# Patient Record
Sex: Female | Born: 1955 | Race: Black or African American | Hispanic: No | Marital: Single | Smoking: Former smoker
Health system: Southern US, Community
[De-identification: ages and names within clinical notes are randomized; demographics above are authoritative.]

## PROBLEM LIST (undated history)

## (undated) DIAGNOSIS — F419 Anxiety disorder, unspecified: Secondary | ICD-10-CM

## (undated) DIAGNOSIS — O019 Hydatidiform mole, unspecified: Secondary | ICD-10-CM

## (undated) DIAGNOSIS — K589 Irritable bowel syndrome without diarrhea: Secondary | ICD-10-CM

## (undated) DIAGNOSIS — R5382 Chronic fatigue, unspecified: Secondary | ICD-10-CM

## (undated) DIAGNOSIS — G9332 Myalgic encephalomyelitis/chronic fatigue syndrome: Secondary | ICD-10-CM

## (undated) DIAGNOSIS — M1990 Unspecified osteoarthritis, unspecified site: Secondary | ICD-10-CM

## (undated) DIAGNOSIS — E162 Hypoglycemia, unspecified: Secondary | ICD-10-CM

## (undated) DIAGNOSIS — I10 Essential (primary) hypertension: Secondary | ICD-10-CM

## (undated) HISTORY — DX: Essential (primary) hypertension: I10

## (undated) HISTORY — DX: Hydatidiform mole, unspecified: O01.9

## (undated) HISTORY — DX: Anxiety disorder, unspecified: F41.9

## (undated) HISTORY — DX: Myalgic encephalomyelitis/chronic fatigue syndrome: G93.32

## (undated) HISTORY — PX: PILONIDAL CYST EXCISION: SHX744

## (undated) HISTORY — DX: Chronic fatigue, unspecified: R53.82

---

## 1997-09-16 ENCOUNTER — Encounter: Admission: RE | Admit: 1997-09-16 | Discharge: 1997-09-16 | Payer: Self-pay | Admitting: Family Medicine

## 1997-11-19 ENCOUNTER — Encounter: Admission: RE | Admit: 1997-11-19 | Discharge: 1997-11-19 | Payer: Self-pay | Admitting: Family Medicine

## 1997-12-24 ENCOUNTER — Encounter: Admission: RE | Admit: 1997-12-24 | Discharge: 1997-12-24 | Payer: Self-pay | Admitting: Family Medicine

## 1998-01-04 ENCOUNTER — Encounter: Admission: RE | Admit: 1998-01-04 | Discharge: 1998-01-04 | Payer: Self-pay | Admitting: Sports Medicine

## 1998-01-27 ENCOUNTER — Encounter: Admission: RE | Admit: 1998-01-27 | Discharge: 1998-01-27 | Payer: Self-pay | Admitting: Family Medicine

## 1998-07-19 ENCOUNTER — Encounter: Admission: RE | Admit: 1998-07-19 | Discharge: 1998-07-19 | Payer: Self-pay | Admitting: Family Medicine

## 1998-07-23 ENCOUNTER — Encounter: Admission: RE | Admit: 1998-07-23 | Discharge: 1998-07-23 | Payer: Self-pay | Admitting: Family Medicine

## 1998-07-26 ENCOUNTER — Encounter: Admission: RE | Admit: 1998-07-26 | Discharge: 1998-07-26 | Payer: Self-pay | Admitting: Family Medicine

## 1998-07-27 ENCOUNTER — Encounter: Admission: RE | Admit: 1998-07-27 | Discharge: 1998-07-27 | Payer: Self-pay | Admitting: Sports Medicine

## 1998-08-11 ENCOUNTER — Encounter: Admission: RE | Admit: 1998-08-11 | Discharge: 1998-08-11 | Payer: Self-pay | Admitting: Family Medicine

## 1998-08-20 ENCOUNTER — Other Ambulatory Visit: Admission: RE | Admit: 1998-08-20 | Discharge: 1998-08-20 | Payer: Self-pay | Admitting: *Deleted

## 1998-10-22 ENCOUNTER — Encounter: Admission: RE | Admit: 1998-10-22 | Discharge: 1998-10-22 | Payer: Self-pay | Admitting: Family Medicine

## 1998-11-16 ENCOUNTER — Encounter: Admission: RE | Admit: 1998-11-16 | Discharge: 1998-11-16 | Payer: Self-pay | Admitting: Sports Medicine

## 1999-06-17 ENCOUNTER — Encounter: Admission: RE | Admit: 1999-06-17 | Discharge: 1999-06-17 | Payer: Self-pay | Admitting: Family Medicine

## 1999-06-24 ENCOUNTER — Encounter: Admission: RE | Admit: 1999-06-24 | Discharge: 1999-06-24 | Payer: Self-pay | Admitting: Family Medicine

## 1999-07-21 ENCOUNTER — Encounter: Admission: RE | Admit: 1999-07-21 | Discharge: 1999-07-21 | Payer: Self-pay | Admitting: *Deleted

## 1999-11-29 ENCOUNTER — Encounter: Admission: RE | Admit: 1999-11-29 | Discharge: 1999-11-29 | Payer: Self-pay | Admitting: Sports Medicine

## 2000-02-08 ENCOUNTER — Encounter: Admission: RE | Admit: 2000-02-08 | Discharge: 2000-02-08 | Payer: Self-pay | Admitting: Family Medicine

## 2000-02-28 ENCOUNTER — Encounter: Payer: Self-pay | Admitting: *Deleted

## 2000-02-28 ENCOUNTER — Ambulatory Visit (HOSPITAL_COMMUNITY): Admission: RE | Admit: 2000-02-28 | Discharge: 2000-02-28 | Payer: Self-pay | Admitting: *Deleted

## 2000-04-11 ENCOUNTER — Encounter: Admission: RE | Admit: 2000-04-11 | Discharge: 2000-04-11 | Payer: Self-pay | Admitting: Family Medicine

## 2000-04-17 ENCOUNTER — Encounter: Admission: RE | Admit: 2000-04-17 | Discharge: 2000-04-17 | Payer: Self-pay | Admitting: Family Medicine

## 2000-08-09 ENCOUNTER — Other Ambulatory Visit: Admission: RE | Admit: 2000-08-09 | Discharge: 2000-08-09 | Payer: Self-pay | Admitting: Obstetrics and Gynecology

## 2000-09-11 ENCOUNTER — Encounter: Admission: RE | Admit: 2000-09-11 | Discharge: 2000-09-11 | Payer: Self-pay | Admitting: Family Medicine

## 2000-09-13 ENCOUNTER — Encounter: Admission: RE | Admit: 2000-09-13 | Discharge: 2000-09-13 | Payer: Self-pay | Admitting: Sports Medicine

## 2000-09-13 ENCOUNTER — Encounter: Payer: Self-pay | Admitting: Sports Medicine

## 2000-10-04 ENCOUNTER — Encounter: Admission: RE | Admit: 2000-10-04 | Discharge: 2000-10-04 | Payer: Self-pay | Admitting: Family Medicine

## 2000-10-05 ENCOUNTER — Encounter: Admission: RE | Admit: 2000-10-05 | Discharge: 2000-11-09 | Payer: Self-pay | Admitting: Sports Medicine

## 2001-05-10 ENCOUNTER — Encounter: Admission: RE | Admit: 2001-05-10 | Discharge: 2001-05-10 | Payer: Self-pay | Admitting: Family Medicine

## 2001-07-02 ENCOUNTER — Emergency Department (HOSPITAL_COMMUNITY): Admission: EM | Admit: 2001-07-02 | Discharge: 2001-07-03 | Payer: Self-pay | Admitting: Emergency Medicine

## 2001-07-03 ENCOUNTER — Encounter
Admission: RE | Admit: 2001-07-03 | Discharge: 2001-07-03 | Payer: Self-pay | Admitting: Physical Medicine & Rehabilitation

## 2001-08-13 ENCOUNTER — Encounter: Admission: RE | Admit: 2001-08-13 | Discharge: 2001-08-13 | Payer: Self-pay | Admitting: Surgery

## 2001-08-13 ENCOUNTER — Encounter: Payer: Self-pay | Admitting: Surgery

## 2001-08-14 ENCOUNTER — Other Ambulatory Visit: Admission: RE | Admit: 2001-08-14 | Discharge: 2001-08-14 | Payer: Self-pay | Admitting: *Deleted

## 2001-11-20 ENCOUNTER — Encounter: Admission: RE | Admit: 2001-11-20 | Discharge: 2001-11-20 | Payer: Self-pay | Admitting: Family Medicine

## 2002-03-21 ENCOUNTER — Ambulatory Visit (HOSPITAL_COMMUNITY): Admission: RE | Admit: 2002-03-21 | Discharge: 2002-03-21 | Payer: Self-pay | Admitting: Gastroenterology

## 2002-07-22 ENCOUNTER — Other Ambulatory Visit: Admission: RE | Admit: 2002-07-22 | Discharge: 2002-07-22 | Payer: Self-pay | Admitting: *Deleted

## 2002-07-22 ENCOUNTER — Encounter: Admission: RE | Admit: 2002-07-22 | Discharge: 2002-07-22 | Payer: Self-pay | Admitting: *Deleted

## 2002-07-22 ENCOUNTER — Encounter: Payer: Self-pay | Admitting: *Deleted

## 2002-09-19 ENCOUNTER — Ambulatory Visit (HOSPITAL_COMMUNITY): Admission: RE | Admit: 2002-09-19 | Discharge: 2002-09-19 | Payer: Self-pay | Admitting: *Deleted

## 2003-04-03 ENCOUNTER — Encounter: Admission: RE | Admit: 2003-04-03 | Discharge: 2003-04-03 | Payer: Self-pay | Admitting: Internal Medicine

## 2003-04-23 ENCOUNTER — Encounter: Admission: RE | Admit: 2003-04-23 | Discharge: 2003-04-23 | Payer: Self-pay | Admitting: Obstetrics

## 2005-03-10 ENCOUNTER — Encounter: Admission: RE | Admit: 2005-03-10 | Discharge: 2005-03-10 | Payer: Self-pay | Admitting: Internal Medicine

## 2005-04-19 ENCOUNTER — Encounter: Admission: RE | Admit: 2005-04-19 | Discharge: 2005-04-19 | Payer: Self-pay | Admitting: Gastroenterology

## 2005-05-29 ENCOUNTER — Encounter: Admission: RE | Admit: 2005-05-29 | Discharge: 2005-05-29 | Payer: Self-pay | Admitting: Internal Medicine

## 2005-06-13 ENCOUNTER — Ambulatory Visit (HOSPITAL_COMMUNITY): Admission: RE | Admit: 2005-06-13 | Discharge: 2005-06-13 | Payer: Self-pay | Admitting: Gastroenterology

## 2005-10-18 ENCOUNTER — Other Ambulatory Visit: Admission: RE | Admit: 2005-10-18 | Discharge: 2005-10-18 | Payer: Self-pay | Admitting: Obstetrics and Gynecology

## 2007-09-25 IMAGING — CR DG UGI W/ SMALL BOWEL HIGH DENSITY
3 series · 3 of 3 positions shown · non-contrast
Comparison: none

CLINICAL DATA: Weight loss.
 KUB:  
 Unremarkable.
 UPPER GI AND SMALL BOWEL FOLLOW THROUGH:
 Swallowing mechanism normal.  No lesions of the esophagus.  No hiatal hernia or reflux.  
 Stomach, pyloric region, and duodenal bulb normal.  C-loop and remainder of duodenum normal.
 An overhead image at 20 minutes shows barium just reaching the colon.  Subsequent fluoroscopy with spot films show rather brisk peristalsis.  There are no focal lesions of the small bowel.  On initial spot films of the terminal ileum, there appears to be a large filling defect in the tip of the cecum.  It looks mottled and is likely stool, but I did feel obligated to do delayed images to make sure this was not a mass.  Respotting this area about 10 minutes later shows that the cecum fills in almost entirely.  There was still a small filing defect.  However, there is clearly not a large cecal mass as was questioned earlier.  This is a bolus of stool in the cecum simulating a mass.

[view not recorded (1 of 3)]
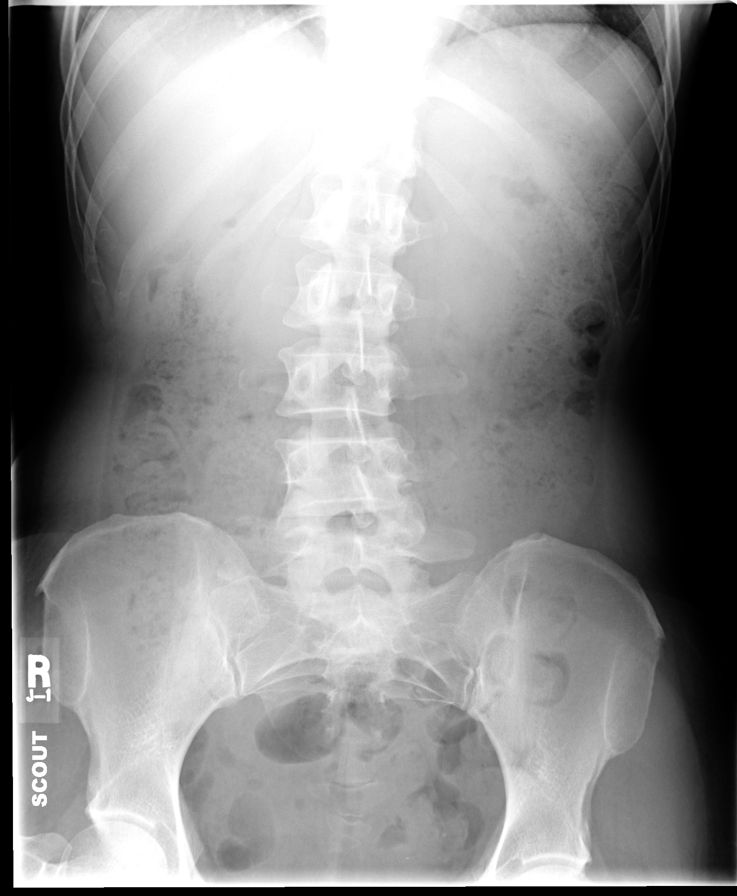

[view not recorded (2 of 3)]
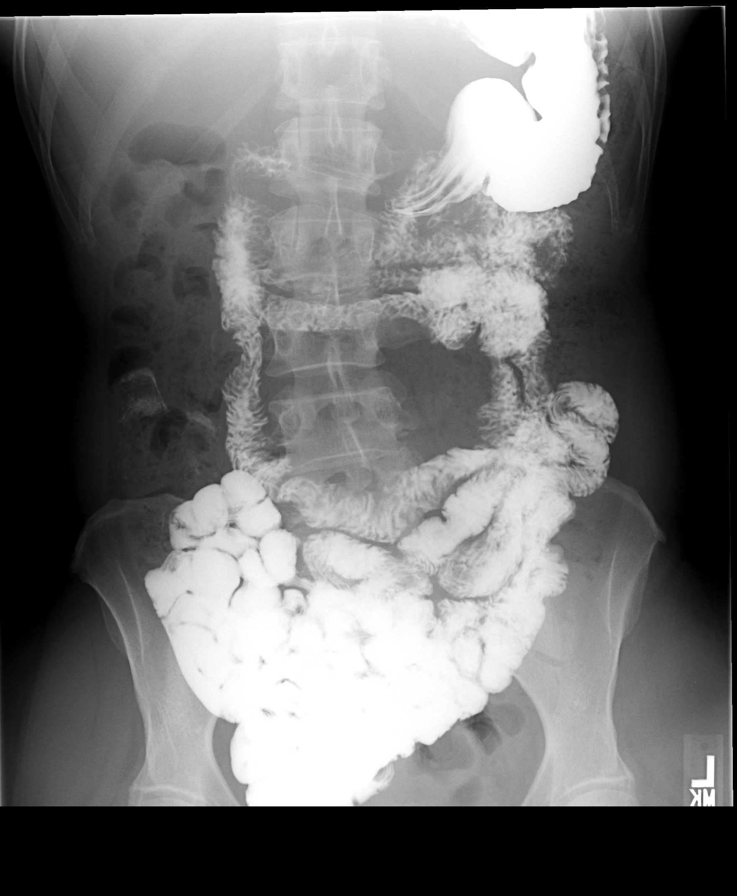

[view not recorded (3 of 3)]
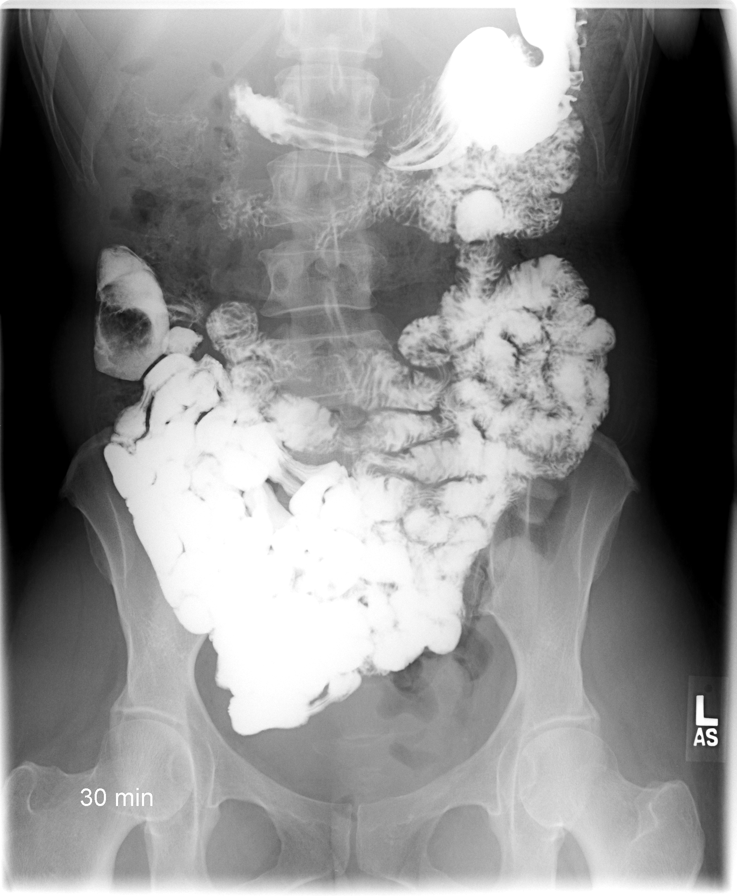

[3 of 3 positions shown; findings below may reference images not displayed]

IMPRESSION: 1.  Upper GI series normal.
 2.  Small bowel follow through shows no lesions of the small bowel, but there is rapid transit to the colon with hyperperistalsis. 
 3.  Initially, there was a ?filling defect? in the tip of the cecum that was shown to be stool on delayed images.

## 2010-06-11 ENCOUNTER — Encounter: Payer: Self-pay | Admitting: Internal Medicine

## 2010-06-12 ENCOUNTER — Encounter: Payer: Self-pay | Admitting: Obstetrics and Gynecology

## 2010-06-12 ENCOUNTER — Encounter: Payer: Self-pay | Admitting: Internal Medicine

## 2011-03-03 ENCOUNTER — Other Ambulatory Visit: Payer: Self-pay | Admitting: Obstetrics and Gynecology

## 2011-03-03 DIAGNOSIS — N644 Mastodynia: Secondary | ICD-10-CM

## 2011-03-27 ENCOUNTER — Other Ambulatory Visit: Payer: Self-pay

## 2011-04-28 ENCOUNTER — Other Ambulatory Visit (HOSPITAL_COMMUNITY): Payer: Self-pay | Admitting: Internal Medicine

## 2011-04-28 ENCOUNTER — Other Ambulatory Visit (HOSPITAL_COMMUNITY): Payer: Self-pay | Admitting: Emergency Medicine

## 2011-05-11 ENCOUNTER — Other Ambulatory Visit: Payer: Self-pay

## 2011-07-21 ENCOUNTER — Other Ambulatory Visit: Payer: Self-pay

## 2015-03-15 ENCOUNTER — Encounter: Payer: Self-pay | Admitting: Medical

## 2015-03-16 ENCOUNTER — Encounter: Payer: Self-pay | Admitting: *Deleted

## 2015-04-12 ENCOUNTER — Encounter: Payer: Self-pay | Admitting: Obstetrics and Gynecology

## 2015-04-12 ENCOUNTER — Other Ambulatory Visit (HOSPITAL_COMMUNITY)
Admission: RE | Admit: 2015-04-12 | Discharge: 2015-04-12 | Disposition: A | Discharge status: Home / Self Care | Payer: Medicaid Other | Source: Ambulatory Visit | Attending: Obstetrics and Gynecology | Admitting: Obstetrics and Gynecology

## 2015-04-12 ENCOUNTER — Ambulatory Visit (INDEPENDENT_AMBULATORY_CARE_PROVIDER_SITE_OTHER): Payer: Medicaid Other | Admitting: Obstetrics and Gynecology

## 2015-04-12 VITALS — BP 185/92 | HR 69 | Temp 98.5°F | Ht 65.5 in | Wt 154.0 lb

## 2015-04-12 DIAGNOSIS — N760 Acute vaginitis: Secondary | ICD-10-CM

## 2015-04-12 DIAGNOSIS — Z1151 Encounter for screening for human papillomavirus (HPV): Secondary | ICD-10-CM | POA: Diagnosis not present

## 2015-04-12 DIAGNOSIS — Z124 Encounter for screening for malignant neoplasm of cervix: Secondary | ICD-10-CM

## 2015-04-12 DIAGNOSIS — Z01411 Encounter for gynecological examination (general) (routine) with abnormal findings: Secondary | ICD-10-CM | POA: Diagnosis present

## 2015-04-12 NOTE — Progress Notes (Signed)
Patient ID: Anna DuverneyCathy R Stawicki, female   DOB: 10/08/1955, 59 y.o.   MRN: [id number] 59 yo G7P7 referred for the evaluation of vaginitis. Patient reports onset of vaginal irritation over the past few weeks. She describes it as a burning sensation when she washes. She uses mild, natural body soaps and laundry detergents. She is not sexually active. Patient also reports the presence of an odor intermittently over the past few weeks. Over the past 2 years or so, she has noticed the presence of a bump on her vulva. She states that the bump will become bigger at times and tender to touch. When squeezed, she is able to express a small amount of clear/greyish fluid. Patient has not had a pap smear in several years and would like one done today. She denies any postmenopausal vaginal bleeding  Past Medical History  Diagnosis Date  . Anxiety   . Hypertension   . Chronic fatigue syndrome   . Trophoblastic disease    Past Surgical History  Procedure Laterality Date  . Pilonidal cyst excision     History reviewed. No pertinent family history. Social History  Substance Use Topics  . Smoking status: Former Smoker    Quit date: 04/11/1992  . Smokeless tobacco: Never Used  . Alcohol Use: No   ROS See pertinent in HPI  Blood pressure 185/92, pulse 69, temperature 98.5 F (36.9 C), temperature source Oral, height 5' 5.5" (1.664 m), weight 154 lb (69.854 kg). GENERAL: Well-developed, well-nourished female in no acute distress.  ABDOMEN: Soft, nontender, nondistended. No organomegaly. PELVIC: Normal external female genitalia. Vagina is atrophic.  Normal discharge. Normal appearing cervix. Uterus is normal in size. No adnexal mass or tenderness. EXTREMITIES: No cyanosis, clubbing, or edema, 2+ distal pulses.  A/P 59 yo with vaginitis - Wet prep collected - pap smear collected. Patient declined a full physical exam - patient has a referral for screening mammogram and is aware that she needs to schedule that  appointment - Advised patient to pay attention to her nutrition and start probiotics as it may help with chronic vaginitis. Patient agrees that when she was taking probiotics, she was without symptoms - Discussed elevated BP today and advised the patient to follow up with her PCP. Patient states she is aware of HTN and takes her meds as needed as she does not like how they make her feel. Advised patient to follow up with PCP to find a regimen that would work better for her and allow her to take her medications as prescribed - Patient will be contacted with any abnormal results - RTC prn

## 2015-04-13 LAB — WET PREP, GENITAL
Clue Cells Wet Prep HPF POC: NONE SEEN
Trich, Wet Prep: NONE SEEN
Yeast Wet Prep HPF POC: NONE SEEN

## 2015-04-14 LAB — CYTOLOGY - PAP

## 2015-06-02 ENCOUNTER — Telehealth: Payer: Self-pay | Admitting: General Practice

## 2015-06-02 NOTE — Telephone Encounter (Signed)
Per Dr Jolayne Pantheronstant, patient needs pap in one year with cotesting for normal pap with +HPV. Called patient, no answer- left message stating we are trying to reach you with non urgent results, please call us back at the clinics.

## 2015-06-02 NOTE — Telephone Encounter (Signed)
Anna AngCathy called an left another message stating she is returning the nurse call. Please call patient between 12-12:30 and 4:00-4:30 ok to leave a message.

## 2015-06-02 NOTE — Telephone Encounter (Signed)
Anna Jenkins left a message today at 1:31 stating she is responding to call she received re: results. Called [name] and left a message I am returning her call. We are trying to reach her with some results- if you are comfortable with us leaving results on your voicemail- call us back and leave us that information on our voicemail.

## 2015-06-04 NOTE — Telephone Encounter (Signed)
Left a detailed message concerning pap smear results.

## 2015-06-08 ENCOUNTER — Telehealth: Payer: Self-pay | Admitting: *Deleted

## 2015-06-08 NOTE — Telephone Encounter (Signed)
Late entry for 06/07/15 5pm I received a message from Dr. Jolayne Panther to notify Anna Jenkins her pap smear from 03/2015 was negative for dysplacia but positive for hrhpv and reccomendation was for pap with cotesting in one year.    I called Anna Jenkins 06/07/15 5pm and notified her of results of pap and reccommendation. She voiced understanding but was concerned with the delay which we discussed. I apologized for the delay.

## 2015-06-08 NOTE — Telephone Encounter (Signed)
-----   Message from Darrel Hoover, RN sent at 06/08/2015  8:09 AM EST ----- Bonita Quin, I do not see anything documented about your conversation with this patient.  Geraldine Contras has a note that she called the patient and left a message with results.  Did the patient call back to complain?  I need you to document a note please.  Then I will give her a call. Thanks, kelly ----- Message -----    From: Gerome Apley, RN    Sent: 06/07/2015   5:15 PM      To: Darrel Hoover, RN  I called Lynden Ang to give her results of pap ( I had found on pap logs had not been followed up and sent message to Dr. Jolayne Panther who sent messge back to me , so I called patient) ( then saw apparently [facility] had called patient.   Anyways patient voices concern about why it took so long, and it looks like she had called to find out and we had tried calling her several times. She said she wants to voice a complaint. I tried talking with her and apologizing for the delay. She would like you to call her asap. It told her it would not be today, but maybe Tues or Wed you would call her.  Thanks [employer]

## 2016-09-25 ENCOUNTER — Ambulatory Visit: Payer: Medicaid Other | Admitting: Obstetrics and Gynecology

## 2016-10-20 ENCOUNTER — Ambulatory Visit (INDEPENDENT_AMBULATORY_CARE_PROVIDER_SITE_OTHER): Payer: Self-pay | Admitting: Family

## 2016-10-25 ENCOUNTER — Ambulatory Visit (INDEPENDENT_AMBULATORY_CARE_PROVIDER_SITE_OTHER): Payer: Self-pay | Admitting: Orthopaedic Surgery

## 2016-12-13 ENCOUNTER — Ambulatory Visit (INDEPENDENT_AMBULATORY_CARE_PROVIDER_SITE_OTHER): Payer: Medicaid Other

## 2016-12-13 ENCOUNTER — Ambulatory Visit (INDEPENDENT_AMBULATORY_CARE_PROVIDER_SITE_OTHER): Payer: Medicaid Other | Admitting: Orthopaedic Surgery

## 2016-12-13 DIAGNOSIS — M25561 Pain in right knee: Secondary | ICD-10-CM

## 2016-12-13 DIAGNOSIS — M5441 Lumbago with sciatica, right side: Secondary | ICD-10-CM

## 2016-12-13 DIAGNOSIS — M25551 Pain in right hip: Secondary | ICD-10-CM

## 2016-12-13 NOTE — Progress Notes (Signed)
Office Visit Note   Patient: Anna Jenkins           Date of Birth: 11/18/1955           MRN: [id number] Visit Date: 12/13/2016              Requested by: No referring provider defined for this encounter. PCP: Anna Jenkins   Assessment & Plan: Visit Diagnoses:  1. Pain in right hip   2. Acute pain of right knee   3. Acute right-sided low back pain with right-sided sciatica     Plan: I offered her a steroid injection in her right knee today as well as an aspiration but she like to wait for a few weeks while she gets some of the inflammation of her body and cuts back on wheat. I do feel be essential to try physical therapy to work on her back hip and knee and we'll order outpatient physical therapy through Anna Jenkins for varus modalities to help decrease her pain including even considering a TENS unit but mainly ionophoresis, phonophoresis and e-stim. All questions were encouraged and answered. I've also recommended her trying a topical over-the-counter medication such as Aspercreme with lidocaine and it.  Follow-Up Instructions: Return in about 3 weeks (around 01/03/2017).   Orders:  Orders Placed This Encounter  Procedures  . XR Pelvis 1-2 Views  . XR Knee 1-2 Views Right   No orders of the defined types were placed in this encounter.     Procedures: No procedures performed   Clinical Data: No additional findings.   Subjective: No chief complaint on file. The patient is someone I've seen before. She comes in today with acute right-sided pain that is accommodation of sciatica as well as knee pain. This is really flared up for last 3 days and she has significant inflammation. She is someone who has significant allergies to most medication does not tolerate most medications whether there prescription or over-the-counter. She denies any recent injuries but is having a lot of discomfort getting in and out of her car and performing activities of daily living. She denies  any change in bowel or bladder function. She does state that wheat and her system can cause a significant amount of inflammation. Denies any headache, chest pain, shortness of breath, fever, chills, nausea, vomiting.  HPI  Review of Systems   Objective: Vital Signs: There were no vitals taken for this visit.  Physical Exam She is alert and oriented 3 and in no acute distress Ortho Exam Examination does show pain and sciatic region and low back on the right side. She has a positive straight leg raise on the right side. Her hip exam itself is normal the right side. She has significant patellofemoral grinding and a mild to moderate knee effusion. The knee feels Anna Jenkins a stable with good range of motion Specialty Comments:  No specialty comments available.  Imaging: Xr Knee 1-2 Views Right  Result Date: 12/13/2016 An AP and lateral of the right knee shows well maintained medial lateral compartments however there is severe patellofemoral arthritic changes  Xr Pelvis 1-2 Views  Result Date: 12/13/2016 An AP pelvis shows normal-appearing right and left hips with no significant arthritic changes in no acute findings.    PMFS History: There are no active problems to display for this patient.  Past Medical History:  Diagnosis Date  . Anxiety   . Chronic fatigue syndrome   . Hypertension   . Trophoblastic disease  No family history on file.  Past Surgical History:  Procedure Laterality Date  . PILONIDAL CYST EXCISION     Social History   Occupational History  . Not on file.   Social History Main Topics  . Smoking status: Former Smoker    Quit date: 04/11/1992  . Smokeless tobacco: Never Used  . Alcohol use No  . Drug use: No  . Sexual activity: Not Currently    Birth control/ protection: Post-menopausal

## 2016-12-14 ENCOUNTER — Other Ambulatory Visit (INDEPENDENT_AMBULATORY_CARE_PROVIDER_SITE_OTHER): Payer: Self-pay

## 2016-12-14 DIAGNOSIS — M25561 Pain in right knee: Principal | ICD-10-CM

## 2016-12-14 DIAGNOSIS — M25551 Pain in right hip: Secondary | ICD-10-CM

## 2016-12-14 DIAGNOSIS — G8929 Other chronic pain: Secondary | ICD-10-CM

## 2017-01-03 ENCOUNTER — Ambulatory Visit (INDEPENDENT_AMBULATORY_CARE_PROVIDER_SITE_OTHER): Payer: Medicaid Other | Admitting: Orthopaedic Surgery

## 2017-01-15 ENCOUNTER — Ambulatory Visit (INDEPENDENT_AMBULATORY_CARE_PROVIDER_SITE_OTHER): Payer: Medicaid Other | Admitting: Orthopaedic Surgery

## 2017-01-29 ENCOUNTER — Ambulatory Visit (INDEPENDENT_AMBULATORY_CARE_PROVIDER_SITE_OTHER): Payer: Medicaid Other | Admitting: Orthopaedic Surgery

## 2017-02-12 ENCOUNTER — Ambulatory Visit (INDEPENDENT_AMBULATORY_CARE_PROVIDER_SITE_OTHER): Payer: Medicaid Other | Admitting: Orthopaedic Surgery

## 2017-02-26 ENCOUNTER — Ambulatory Visit (INDEPENDENT_AMBULATORY_CARE_PROVIDER_SITE_OTHER): Payer: Medicaid Other | Admitting: Orthopaedic Surgery

## 2017-03-14 ENCOUNTER — Ambulatory Visit (INDEPENDENT_AMBULATORY_CARE_PROVIDER_SITE_OTHER): Payer: Medicaid Other | Admitting: Orthopaedic Surgery

## 2017-04-04 ENCOUNTER — Ambulatory Visit (INDEPENDENT_AMBULATORY_CARE_PROVIDER_SITE_OTHER): Payer: Medicaid Other | Admitting: Orthopaedic Surgery

## 2017-05-21 ENCOUNTER — Ambulatory Visit (INDEPENDENT_AMBULATORY_CARE_PROVIDER_SITE_OTHER): Payer: Medicaid Other

## 2017-05-21 ENCOUNTER — Encounter (INDEPENDENT_AMBULATORY_CARE_PROVIDER_SITE_OTHER): Payer: Self-pay | Admitting: Specialist

## 2017-05-21 ENCOUNTER — Ambulatory Visit (INDEPENDENT_AMBULATORY_CARE_PROVIDER_SITE_OTHER): Payer: Medicaid Other | Admitting: Specialist

## 2017-05-21 VITALS — BP 127/78 | HR 65 | Ht 66.0 in | Wt 163.0 lb

## 2017-05-21 DIAGNOSIS — M2550 Pain in unspecified joint: Secondary | ICD-10-CM

## 2017-05-21 DIAGNOSIS — M79641 Pain in right hand: Secondary | ICD-10-CM | POA: Diagnosis not present

## 2017-05-21 MED ORDER — KETOROLAC TROMETHAMINE 30 MG/ML IJ SOLN: 30.0000 mg | Freq: Once | INTRAMUSCULAR | Status: AC

## 2017-05-21 MED ORDER — METHYLPREDNISOLONE ACETATE 80 MG/ML IJ SUSP: 80.0000 mg | Freq: Once | INTRAMUSCULAR | Status: DC

## 2017-05-21 NOTE — Progress Notes (Signed)
Office Visit Note   Patient: Anna Jenkins           Date of Birth: 1955-10-25           MRN: [id number] Visit Date: 05/21/2017              Requested by: Nolene Ebbs, MD [address] [place], [place] [address] PCP: Nolene Ebbs, MD   Assessment & Plan: Visit Diagnoses:  1. Pain in right hand   2. Polyarthralgia     Plan: With patient's increased right hand pain and polyarthralgias over the last several years recommend getting CBC and arthritis panel. Patient follow with me in a couple weeks to discuss lab results. In hopes of getting some relief of her pain patient was given Depo-Medrol 80 mg and Toradol 30 mg IM injections. I did offer to give patient a prednisone 6 day taper but she declined due to her not tolerating multiple medications from GI issues.  Patient sees a homeopathic doctor for her GI problem and has not seen a true GI specialist in several years. I recommend that she follow-up with gastroenterology to discuss possible endoscopy and question colonoscopy.  Follow-Up Instructions: Return in about 2 weeks (around 06/04/2017) for Jin Shockley to review labs.   Orders:  Orders Placed This Encounter  Procedures  . XR Hand Complete Right  . CBC  . Antinuclear Antib (ANA)  . Sed Rate (ESR)  . Uric acid  . Rheumatoid Factor  . C-reactive protein   Meds ordered this encounter  Medications  . methylPREDNISolone acetate (DEPO-MEDROL) injection 80 mg  . ketorolac (TORADOL) 30 MG/ML injection 30 mg      Procedures: No procedures performed   Clinical Data: No additional findings.   Subjective: Chief Complaint  Patient presents with  . Right Hand - Pain, Edema    HPI Patient comes in today with complaints of worsening right hand pain 1 week. Patient states that she has increased right hand pain and swelling more localized to the third MCP joint. She admits to having chronic swelling and stiffness bilateral hand MCP joints over the last several years but  never pain to this extent. Patient is right-hand dominant and has marked discomfort with attempting to grab objects. Decreased range of motion at the third MCP joint. Patient also complains of chronic aches and pains in bilateral shoulders, elbows and knees. States that she sees Dr. Ninfa Linden for bilateral knee osteoarthritis. Patient does not take many medications due to GI intolerance. She sees a homeopathic doctor. Has not had endoscopy and colonoscopy at least 7 years. Review of Systems  Constitutional: Negative.   HENT: Negative.   Eyes: Negative.   Respiratory: Negative.   Gastrointestinal: Negative.   Musculoskeletal: Positive for joint swelling.  Neurological: Negative.   Psychiatric/Behavioral: Negative.    No current cardiopulmonary GI GU issues  Objective: Vital Signs: BP 127/78 (BP Location: Left Arm, Patient Position: Sitting)   Pulse 65   Ht _0  (1.676 m)   Wt 163 lb (73.9 kg)   BMI 26.31 kg/m   Physical Exam  Constitutional: She is oriented to person, place, and time. She appears well-developed. No distress.  HENT:  Head: Normocephalic and atraumatic.  Eyes: Pupils are equal, round, and reactive to light.  Neck: Normal range of motion.  Pulmonary/Chest: No respiratory distress.  Musculoskeletal:  Bilateral shoulders good range of motion. Mild positive right greater left impingement test. Negative drop arm test. Good cuff strength. Bilateral elbows good range motion. Right  greater than left elbow tender palpation. Negative Tinel's bilateral cubital tunnels. Bilateral wrist good range of motion. Negative Tinel's and Phalen's test. Left hand she does have mild tenderness and swelling across the second and third MCP joints. Right hand she has moderate to marked swelling around the third MCP joint. Joint stiffness and decreased range of motion. Joint is exquisitely tender to palpation. Less tender over the second and fourth MCP joints. Neurovascularly intact. Bilateral knees  positive patellofemoral crepitus. Joint line is tender. Bilateral ankles good range of motion. Tender across the bilateral anterior tibiotalar joint lines.  Neurological: She is alert and oriented to person, place, and time.  Skin: Skin is warm and dry.  Psychiatric: She has a normal mood and affect.    Ortho Exam  Specialty Comments:  No specialty comments available.  Imaging: Xr Hand Complete Right  Result Date: 05/21/2017 X-ray right hand shows degenerative changes and periarticular spurring at the third MCP joint. No acute finding.    PMFS History: There are no active problems to display for this patient.  Past Medical History:  Diagnosis Date  . Anxiety   . Chronic fatigue syndrome   . Hypertension   . Trophoblastic disease     History reviewed. No pertinent family history.  Past Surgical History:  Procedure Laterality Date  . PILONIDAL CYST EXCISION     Social History   Occupational History  . Not on file  Tobacco Use  . Smoking status: Former Smoker    Last attempt to quit: 04/11/1992    Years since quitting: 25.1  . Smokeless tobacco: Never Used  Substance and Sexual Activity  . Alcohol use: No  . Drug use: No  . Sexual activity: Not Currently    Birth control/protection: Post-menopausal

## 2017-05-24 LAB — CBC
HCT: 41 % (ref 35.0–45.0)
Hemoglobin: 13.6 g/dL (ref 11.7–15.5)
MCH: 27.4 pg (ref 27.0–33.0)
MCHC: 33.2 g/dL (ref 32.0–36.0)
MCV: 82.5 fL (ref 80.0–100.0)
MPV: 10.4 fL (ref 7.5–12.5)
Platelets: 294 10*3/uL (ref 140–400)
RBC: 4.97 10*6/uL (ref 3.80–5.10)
RDW: 14.1 % (ref 11.0–15.0)
WBC: 4.8 10*3/uL (ref 3.8–10.8)

## 2017-05-24 LAB — ANA: Anti Nuclear Antibody(ANA): NEGATIVE

## 2017-05-24 LAB — C-REACTIVE PROTEIN: CRP: 3.2 mg/L (ref <8.0)

## 2017-05-24 LAB — URIC ACID: Uric Acid, Serum: 3.4 mg/dL (ref 2.5–7.0)

## 2017-05-24 LAB — RHEUMATOID FACTOR: Rheumatoid fact SerPl-aCnc: <14 IU/mL (ref <14)

## 2017-05-24 LAB — SEDIMENTATION RATE: Sed Rate: 9 mm/h (ref 0–30)

## 2017-06-05 ENCOUNTER — Encounter (INDEPENDENT_AMBULATORY_CARE_PROVIDER_SITE_OTHER): Payer: Self-pay | Admitting: Specialist

## 2017-06-05 ENCOUNTER — Ambulatory Visit (INDEPENDENT_AMBULATORY_CARE_PROVIDER_SITE_OTHER): Payer: Medicaid Other | Admitting: Specialist

## 2017-06-05 DIAGNOSIS — M222X1 Patellofemoral disorders, right knee: Secondary | ICD-10-CM

## 2017-06-05 DIAGNOSIS — M19041 Primary osteoarthritis, right hand: Secondary | ICD-10-CM

## 2017-06-05 MED ORDER — DICLOFENAC SODIUM 1 % TD GEL: 2.0000 g | Freq: Four times a day (QID) | TRANSDERMAL | 2 refills | Status: AC

## 2017-06-05 NOTE — Patient Instructions (Signed)
Epsom salts warm soaks BID Grip strengthening See PT for knees and OT for hands.

## 2017-06-05 NOTE — Progress Notes (Signed)
Office Visit Note   Patient: Anna Jenkins           Date of Birth: 05/11/1956           MRN: [id number] Visit Date: 06/05/2017              Requested by: Fleet ContrasAvbuere, Edwin, MD [address] [place], [place] [id number] PCP: Fleet ContrasAvbuere, Edwin, MD   Assessment & Plan: Visit Diagnoses:  1. Primary osteoarthritis, right hand   2. 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 But she has redused ROM of the right long finger MCP. Also notes nail changes and hair falling out. ?malabsorption stomach issues.  Plan: Epsom salts warm soaks BID Grip strengthening See PT for knees and OT for hands  Follow-Up Instructions: Return in about 4 weeks (around 07/03/2017).   Orders:  No orders of the defined types were placed in this encounter.  No orders of the defined types were placed in this encounter.     Procedures: No procedures performed   Clinical Data: No additional findings.   Subjective: No chief complaint on file.   62 year old female with Patellofemoral OA and right hand middle finger OA, pain and swelling is better. But she has noticed ROM of the right long finger MCP. Also notes nail changes and hair falling out. ?malabsorption stomach issues. She took steroids by mouth, reports intolerance of NSAIDs due to stomach pain. Knee pain is worsened as well.     Review of Systems  Constitutional: Negative.   HENT: Negative.   Eyes: Negative.   Respiratory: Negative.   Cardiovascular: Negative.   Gastrointestinal: Negative.   Endocrine: Negative.   Genitourinary: Negative.   Musculoskeletal: Negative.   Skin: Negative.   Allergic/Immunologic: Negative.   Neurological: Negative.   Hematological: Negative.   Psychiatric/Behavioral: Negative.      Objective: Vital Signs: There were no vitals taken for this visit.  Physical Exam  Constitutional: She is oriented to person, place, and time. She  appears well-developed and well-nourished.  HENT:  Head: Normocephalic and atraumatic.  Eyes: EOM are normal. Pupils are equal, round, and reactive to light.  Neck: Normal range of motion. Neck supple.  Pulmonary/Chest: Effort normal and breath sounds normal.  Abdominal: Soft. Bowel sounds are normal.  Musculoskeletal: Normal range of motion.  Neurological: She is alert and oriented to person, place, and time.  Skin: Skin is warm and dry.  Psychiatric: She has a normal mood and affect. Her behavior is normal. Judgment and thought content normal.    Ortho Exam  Specialty Comments:  No specialty comments available.  Imaging: No results found.   PMFS History: There are no active problems to display for this patient.  Past Medical History:  Diagnosis Date  . Anxiety   . Chronic fatigue syndrome   . Hypertension   . Trophoblastic disease     No family history on file.  Past Surgical History:  Procedure Laterality Date  . PILONIDAL CYST EXCISION     Social History   Occupational History  . Not on file  Tobacco Use  . Smoking status: Former Smoker    Last attempt to quit: 04/11/1992    Years since quitting: 25.1  . Smokeless tobacco: Never Used  Substance and Sexual Activity  . Alcohol use: No  . Drug use: No  . Sexual activity: Not Currently    Birth control/protection: Post-menopausal

## 2017-07-17 ENCOUNTER — Telehealth (INDEPENDENT_AMBULATORY_CARE_PROVIDER_SITE_OTHER): Payer: Self-pay | Admitting: Specialist

## 2017-07-17 NOTE — Telephone Encounter (Signed)
Please call pt to discuss pt care. Pt has questions pertaining to MRI. I did answer some questions for the pt it was some that I wasn't to sure about. If we have an earlier appt time please let pt know she would like to see Dr.Nitka a little earlier.

## 2017-07-19 ENCOUNTER — Ambulatory Visit (INDEPENDENT_AMBULATORY_CARE_PROVIDER_SITE_OTHER): Payer: Medicaid Other | Admitting: Specialist

## 2017-07-20 NOTE — Telephone Encounter (Signed)
I called and spoke with patient and she states that she would like to get MRI's of BOTH knees.  I did advise patient that we had only seen her for her right knee and that we may have to see her before this can be done.  She states that she has had on-going swelling in both her knees. ---Please advise on this.

## 2017-08-15 ENCOUNTER — Ambulatory Visit (INDEPENDENT_AMBULATORY_CARE_PROVIDER_SITE_OTHER): Payer: Medicaid Other | Admitting: Specialist

## 2017-10-18 ENCOUNTER — Telehealth (INDEPENDENT_AMBULATORY_CARE_PROVIDER_SITE_OTHER): Payer: Self-pay | Admitting: Specialist

## 2017-10-18 ENCOUNTER — Ambulatory Visit (INDEPENDENT_AMBULATORY_CARE_PROVIDER_SITE_OTHER): Payer: Medicaid Other

## 2017-10-18 ENCOUNTER — Encounter (INDEPENDENT_AMBULATORY_CARE_PROVIDER_SITE_OTHER): Payer: Self-pay | Admitting: Surgery

## 2017-10-18 ENCOUNTER — Ambulatory Visit (INDEPENDENT_AMBULATORY_CARE_PROVIDER_SITE_OTHER): Payer: Medicaid Other | Admitting: Surgery

## 2017-10-18 DIAGNOSIS — G8929 Other chronic pain: Secondary | ICD-10-CM

## 2017-10-18 DIAGNOSIS — M25562 Pain in left knee: Secondary | ICD-10-CM | POA: Diagnosis not present

## 2017-10-18 DIAGNOSIS — M5441 Lumbago with sciatica, right side: Secondary | ICD-10-CM | POA: Diagnosis not present

## 2017-10-18 DIAGNOSIS — M25561 Pain in right knee: Secondary | ICD-10-CM | POA: Diagnosis not present

## 2017-10-18 DIAGNOSIS — M4316 Spondylolisthesis, lumbar region: Secondary | ICD-10-CM

## 2017-10-18 DIAGNOSIS — M170 Bilateral primary osteoarthritis of knee: Secondary | ICD-10-CM | POA: Diagnosis not present

## 2017-10-18 DIAGNOSIS — M5442 Lumbago with sciatica, left side: Secondary | ICD-10-CM

## 2017-10-18 NOTE — Telephone Encounter (Signed)
Fayrene Fearing advised patient to return for an MRI review with Dr. Otelia Sergeant in 2 weeks, she is scheduled 7/18. She requested to be added to cancellation list. # [phone]

## 2017-10-18 NOTE — Progress Notes (Signed)
Office Visit Note   Patient: Anna Jenkins           Date of Birth: 10/22/1955           MRN: [id number] Visit Date: 10/18/2017              Requested by: Fleet Contras, MD [address] [place], [place] [address] PCP: Fleet Contras, MD   Assessment & Plan: Visit Diagnoses:  1. Chronic bilateral low back pain with bilateral sciatica   2. Chronic pain of left knee   3. Chronic pain of right knee   4. Spondylolisthesis, lumbar region   5. Primary osteoarthritis of both knees     Plan: Regards to patient's bilateral knee problems she will follow with Dr. Magnus Ivan to discuss treatment options there.  We will schedule lumbar MRI to rule out HNP/stenosis.  Follow-up with Dr. Otelia Sergeant after completion to discuss results and further treatment options.  Follow-Up Instructions: Return for With Dr. Magnus Ivan in 1 week for bilateral knees.  With Dr. Otelia Sergeant in 2 weeks to review lumbar spine M.   Orders:  Orders Placed This Encounter  Procedures  . XR Lumbar Spine 2-3 Views  . XR KNEE 3 VIEW RIGHT  . XR KNEE 3 VIEW LEFT  . MR Lumbar Spine w/o contrast   No orders of the defined types were placed in this encounter.     Procedures: No procedures performed   Clinical Data: No additional findings.   Subjective: Chief Complaint  Patient presents with  . Lower Back - Pain    HPI 62 year old female comes in today with complaints of low back pain, bilateral lower extremity radiculopathy and bilateral knee pain.  Patient has seen Dr. Magnus Ivan in the past for her knees.  Both knees aggravated with going up and down stairs, squatting and standing too long.  Complains of popping and grinding both knees and some swelling.  Low back pain off and on for about a year but worse the last few weeks.  No injury.  Complains of pain radiating down to both feet along with numbness and tingling.  States that walking distances have decreased over the last several months due to her ongoing symptoms.   Feels like her legs give out the further she walks.  No complaints of bowel or bladder incontinence. Review of Systems No current cardiac pulmonary GI GU issues  Objective: Vital Signs: There were no vitals taken for this visit.  Physical Exam  Constitutional: She is oriented to person, place, and time. No distress.  HENT:  Head: Normocephalic and atraumatic.  Eyes: Pupils are equal, round, and reactive to light. EOM are normal.  Pulmonary/Chest: No respiratory distress.  Musculoskeletal:  Some decrease in lumbar flexion and extension due to discomfort.  Positive lumbar paraspinal tenderness.  Negative logroll bilateral hips.  Negative straight leg raise.  Patient has tenderness over the left hip greater trochanter bursa.  Bile knees good range motion.  Positive bilateral patellofemoral crepitus and grind.  Positive bilateral medial lateral joint line tenderness.  Knee ligaments are stable.  No focal motor deficits.  Neurovascular intact.  Bilateral calves nontender.  Neurological: She is alert and oriented to person, place, and time.    Ortho Exam  Specialty Comments:  No specialty comments available.  Imaging: No results found.   PMFS History: There are no active problems to display for this patient.  Past Medical History:  Diagnosis Date  . Anxiety   . Chronic fatigue syndrome   .  Hypertension   . Trophoblastic disease     History reviewed. No pertinent family history.  Past Surgical History:  Procedure Laterality Date  . PILONIDAL CYST EXCISION     Social History   Occupational History  . Not on file  Tobacco Use  . Smoking status: Former Smoker    Last attempt to quit: 04/11/1992    Years since quitting: 25.6  . Smokeless tobacco: Never Used  Substance and Sexual Activity  . Alcohol use: No  . Drug use: No  . Sexual activity: Not Currently    Birth control/protection: Post-menopausal

## 2017-10-18 NOTE — Telephone Encounter (Signed)
Put on cancellation list 

## 2017-10-22 ENCOUNTER — Telehealth (INDEPENDENT_AMBULATORY_CARE_PROVIDER_SITE_OTHER): Payer: Self-pay | Admitting: *Deleted

## 2017-10-22 NOTE — Telephone Encounter (Signed)
Pt is scheduled for MRI lumbar spine at [facility] on Wednesday June 5 at 330pm with arrival of 3pm for registeration, I C left message for pt to return my call for appt information

## 2017-10-23 NOTE — Telephone Encounter (Signed)
IC pt again and left vm to return my call for appt information 

## 2017-10-23 NOTE — Telephone Encounter (Signed)
IC and left message on vm with pt appt date, time and location

## 2017-11-01 ENCOUNTER — Telehealth (INDEPENDENT_AMBULATORY_CARE_PROVIDER_SITE_OTHER): Payer: Self-pay | Admitting: Radiology

## 2017-11-01 NOTE — Telephone Encounter (Signed)
Patient called requesting CD of x-rays from 10/18/17 visit of lower back and both knees. I informed patient I will have CD made and placed at the front desk for pick up.

## 2017-11-05 ENCOUNTER — Telehealth (INDEPENDENT_AMBULATORY_CARE_PROVIDER_SITE_OTHER): Payer: Self-pay | Admitting: Radiology

## 2017-11-05 ENCOUNTER — Ambulatory Visit (INDEPENDENT_AMBULATORY_CARE_PROVIDER_SITE_OTHER): Payer: Medicaid Other | Admitting: Orthopaedic Surgery

## 2017-11-05 NOTE — Telephone Encounter (Signed)
Patient called and LM that she is having debilitating pain and needs to be seen ASAP.  She says she lives by herself and cannot even get up and move around and take care of herself.  She has had her MRI scan at [facility].  Her appt with Dr Otelia SergeantNitka is not until 12/06/17.  I advised her there are no sooner appts available, and that I can add her to the cancellation list. (Patient is already on this list and we will call if an appt opens up.) She does not understand why we do not have an emergency walkin clinic.  I have advised her to go to the ED if her pain is that severe.  She understands, but says that will do her no good and she will be calling her PCP for assistance.

## 2017-11-06 ENCOUNTER — Telehealth (INDEPENDENT_AMBULATORY_CARE_PROVIDER_SITE_OTHER): Payer: Self-pay | Admitting: Radiology

## 2017-11-06 NOTE — Telephone Encounter (Signed)
Patient called back yesterday afternoon to speak with a supervisor, and I s/w her again in detail.  She has not had the MRI Lumbar Spine done yet.  She has issues with transportation, and issues with claustrophobia.  She is asking for something to help with her pain.  Maybe a brace, or anything else you can offer.  She says that she does not do well with medications, and does not want to take medication for this pain.  She has an appt with Dr Otelia SergeantNitka 12/06/17.  She wants to be seen emergently, sooner than that.  I have told her there are no sooner appts at this time, but I will review the schedule to see if we have a cancellation or if we can get her in any sooner at all.  Please advise on anything to help with her pain?  I did advise her she really needs to get the MRI scan done, to further evaluate her pain/symptoms.  I again advised her that the ED is available to her as a resource should her pain be so great that she needs to be seen emergently.

## 2017-11-06 NOTE — Telephone Encounter (Signed)
There really isn't much we can do without MRI scan.  If she can get MRI I wound be ok with giving ativan for claustrophobia if she can get a ride.  Cannot give narcotic medication without knowing what I am treating.  Emergent appointment with Dr Otelia SergeantNitka will not be very beneficial because he needs study.  I agree with ED visit if pain is that great.  They can do MRI there if they feel clinically indicated.

## 2017-11-06 NOTE — Telephone Encounter (Signed)
IC patient to advise.  LMVM to call me back, briefly gave this info to her and advised her to call [facility] and if I needed to help her with scheduling the MRI scan I can.  Asked her to call me if needed, and I will still be looking for a cancellation to work her in earlier, but we do need the MRI done to further evaluate.

## 2017-11-29 ENCOUNTER — Telehealth (INDEPENDENT_AMBULATORY_CARE_PROVIDER_SITE_OTHER): Payer: Self-pay | Admitting: Radiology

## 2017-11-29 NOTE — Telephone Encounter (Signed)
Patient called stating original CD did not work.  I have made new CD and checked on 3 different computers, CD works.  Patient is aware CD is ready for pickup. Instructions to open CD was placed in CD sleeve.

## 2017-12-06 ENCOUNTER — Ambulatory Visit (INDEPENDENT_AMBULATORY_CARE_PROVIDER_SITE_OTHER): Payer: Medicaid Other | Admitting: Specialist

## 2017-12-31 ENCOUNTER — Telehealth (INDEPENDENT_AMBULATORY_CARE_PROVIDER_SITE_OTHER): Payer: Self-pay | Admitting: Surgery

## 2017-12-31 NOTE — Telephone Encounter (Signed)
Patient called advised her leg is hurting really bad and did not think Anna Jenkins would see her without her having an MRI. Patient said Anna Jenkins asked her to get one.  Patient said she is afraid to get into the machine to get an MRI.  The number to contact patient is [phone]

## 2017-12-31 NOTE — Telephone Encounter (Signed)
Please advise 

## 2018-01-08 NOTE — Telephone Encounter (Signed)
She needs MRI.

## 2018-01-08 NOTE — Telephone Encounter (Signed)
lmom for pt to call back

## 2018-01-18 NOTE — Telephone Encounter (Signed)
She never returned call

## 2018-06-06 ENCOUNTER — Encounter: Payer: Self-pay | Admitting: *Deleted

## 2018-06-13 ENCOUNTER — Encounter: Payer: Self-pay | Admitting: Obstetrics and Gynecology

## 2019-10-06 ENCOUNTER — Ambulatory Visit: Payer: Medicaid Other | Admitting: Specialist

## 2020-03-12 ENCOUNTER — Telehealth: Payer: Self-pay | Admitting: Orthopaedic Surgery

## 2020-03-12 NOTE — Telephone Encounter (Signed)
calling

## 2020-03-12 NOTE — Telephone Encounter (Signed)
I spoke the pt and she stated she is on her way to have her pcp evaluate her for DVT. She just wanted our advise since we were the speciality office. I advised her that what she is doing is a good plan and if it isnt blood clots causing her pain we will see her in the office Monday to evaluate her bilateral leg pain.

## 2020-03-12 NOTE — Telephone Encounter (Signed)
Pt stated understanding

## 2020-03-12 NOTE — Telephone Encounter (Signed)
Please call patient. Patient has a few questions and states she feels she may have a blood clot in left leg. Please call back asap. Patient phone number is [phone].

## 2020-03-15 ENCOUNTER — Ambulatory Visit: Payer: Medicaid Other | Admitting: Physician Assistant

## 2020-09-23 ENCOUNTER — Other Ambulatory Visit (HOSPITAL_COMMUNITY): Payer: Self-pay | Admitting: Internal Medicine

## 2020-09-23 ENCOUNTER — Inpatient Hospital Stay (HOSPITAL_COMMUNITY): Admission: RE | Admit: 2020-09-23 | Payer: Medicaid Other | Source: Ambulatory Visit

## 2020-09-23 DIAGNOSIS — I83813 Varicose veins of bilateral lower extremities with pain: Secondary | ICD-10-CM

## 2020-09-24 ENCOUNTER — Telehealth (HOSPITAL_COMMUNITY): Payer: Self-pay

## 2020-09-24 NOTE — Telephone Encounter (Signed)
Patient was referred over for U/S to R/O DVT on 09/23/2020, patient No Showed for appointment. I called patient and LVM for her to call back if she would like to reschedule. Fax was sent to Dr Concepcion Elk referring physician.   [employer]

## 2020-09-30 ENCOUNTER — Ambulatory Visit (HOSPITAL_COMMUNITY)
Admission: RE | Admit: 2020-09-30 | Discharge: 2020-09-30 | Disposition: A | Discharge status: Home / Self Care | Payer: Medicare Other | Source: Ambulatory Visit | Attending: Internal Medicine | Admitting: Internal Medicine

## 2020-09-30 ENCOUNTER — Other Ambulatory Visit: Payer: Self-pay

## 2020-09-30 DIAGNOSIS — I83813 Varicose veins of bilateral lower extremities with pain: Secondary | ICD-10-CM | POA: Diagnosis present

## 2021-03-28 ENCOUNTER — Other Ambulatory Visit: Payer: Self-pay | Admitting: Internal Medicine

## 2021-03-28 ENCOUNTER — Other Ambulatory Visit: Payer: Self-pay

## 2021-03-28 DIAGNOSIS — I879 Disorder of vein, unspecified: Secondary | ICD-10-CM

## 2021-03-29 LAB — CBC
HCT: 36 % (ref 35.0–45.0)
Hemoglobin: 12 g/dL (ref 11.7–15.5)
MCH: 27.5 pg (ref 27.0–33.0)
MCHC: 33.3 g/dL (ref 32.0–36.0)
MCV: 82.6 fL (ref 80.0–100.0)
MPV: 9.5 fL (ref 7.5–12.5)
Platelets: 283 10*3/uL (ref 140–400)
RBC: 4.36 10*6/uL (ref 3.80–5.10)
RDW: 13.8 % (ref 11.0–15.0)
WBC: 5.1 10*3/uL (ref 3.8–10.8)

## 2021-03-29 LAB — COMPLETE METABOLIC PANEL WITH GFR
AG Ratio: 1.1 (calc) (ref 1.0–2.5)
ALT: 8 U/L (ref 6–29)
AST: 16 U/L (ref 10–35)
Albumin: 3.6 g/dL (ref 3.6–5.1)
Alkaline phosphatase (APISO): 55 U/L (ref 37–153)
BUN: 8 mg/dL (ref 7–25)
CO2: 29 mmol/L (ref 20–32)
Calcium: 8.6 mg/dL (ref 8.6–10.4)
Chloride: 92 mmol/L — ABNORMAL LOW (ref 98–110)
Creat: 0.69 mg/dL (ref 0.50–1.05)
Globulin: 3.3 g/dL (calc) (ref 1.9–3.7)
Glucose, Bld: 91 mg/dL (ref 65–99)
Potassium: 2.8 mmol/L — ABNORMAL LOW (ref 3.5–5.3)
Sodium: 129 mmol/L — ABNORMAL LOW (ref 135–146)
Total Bilirubin: 0.5 mg/dL (ref 0.2–1.2)
Total Protein: 6.9 g/dL (ref 6.1–8.1)
eGFR: 96 mL/min/{1.73_m2} (ref >=60)

## 2021-03-29 LAB — LIPID PANEL
Cholesterol: 219 mg/dL — ABNORMAL HIGH (ref <200)
HDL: 68 mg/dL (ref >=50)
LDL Cholesterol (Calc): 137 mg/dL (calc) — ABNORMAL HIGH
Non-HDL Cholesterol (Calc): 151 mg/dL (calc) — ABNORMAL HIGH (ref <130)
Total CHOL/HDL Ratio: 3.2 (calc) (ref <5.0)
Triglycerides: 53 mg/dL (ref <150)

## 2021-03-29 LAB — TSH: TSH: 1.71 mIU/L (ref 0.40–4.50)

## 2021-04-19 ENCOUNTER — Encounter: Payer: 59 | Admitting: Vascular Surgery

## 2021-04-19 ENCOUNTER — Encounter (HOSPITAL_COMMUNITY): Payer: 59

## 2021-05-18 ENCOUNTER — Encounter: Payer: Self-pay | Admitting: *Deleted

## 2021-07-14 ENCOUNTER — Ambulatory Visit: Payer: 59 | Admitting: Podiatry

## 2022-03-08 ENCOUNTER — Emergency Department (HOSPITAL_COMMUNITY): Payer: 59

## 2022-03-08 ENCOUNTER — Other Ambulatory Visit: Payer: Self-pay

## 2022-03-08 ENCOUNTER — Encounter (HOSPITAL_COMMUNITY): Payer: Self-pay

## 2022-03-08 ENCOUNTER — Inpatient Hospital Stay (HOSPITAL_COMMUNITY)
Admission: EM | Admit: 2022-03-08 | Discharge: 2022-03-13 | DRG: 554 | Disposition: A | Discharge status: Home Health | Payer: 59 | Attending: Internal Medicine | Admitting: Internal Medicine

## 2022-03-08 DIAGNOSIS — Z885 Allergy status to narcotic agent status: Secondary | ICD-10-CM

## 2022-03-08 DIAGNOSIS — M170 Bilateral primary osteoarthritis of knee: Secondary | ICD-10-CM | POA: Diagnosis present

## 2022-03-08 DIAGNOSIS — I10 Essential (primary) hypertension: Secondary | ICD-10-CM | POA: Diagnosis present

## 2022-03-08 DIAGNOSIS — G9332 Myalgic encephalomyelitis/chronic fatigue syndrome: Secondary | ICD-10-CM | POA: Diagnosis present

## 2022-03-08 DIAGNOSIS — R262 Difficulty in walking, not elsewhere classified: Secondary | ICD-10-CM

## 2022-03-08 DIAGNOSIS — Z87891 Personal history of nicotine dependence: Secondary | ICD-10-CM

## 2022-03-08 DIAGNOSIS — Z87828 Personal history of other (healed) physical injury and trauma: Secondary | ICD-10-CM

## 2022-03-08 DIAGNOSIS — M064 Inflammatory polyarthropathy: Secondary | ICD-10-CM | POA: Diagnosis not present

## 2022-03-08 DIAGNOSIS — F419 Anxiety disorder, unspecified: Secondary | ICD-10-CM | POA: Diagnosis present

## 2022-03-08 DIAGNOSIS — Z888 Allergy status to other drugs, medicaments and biological substances status: Secondary | ICD-10-CM

## 2022-03-08 DIAGNOSIS — Z8759 Personal history of other complications of pregnancy, childbirth and the puerperium: Secondary | ICD-10-CM

## 2022-03-08 DIAGNOSIS — M25562 Pain in left knee: Principal | ICD-10-CM

## 2022-03-08 DIAGNOSIS — K5900 Constipation, unspecified: Secondary | ICD-10-CM | POA: Diagnosis present

## 2022-03-08 DIAGNOSIS — E876 Hypokalemia: Secondary | ICD-10-CM | POA: Diagnosis not present

## 2022-03-08 DIAGNOSIS — M25462 Effusion, left knee: Secondary | ICD-10-CM | POA: Diagnosis present

## 2022-03-08 DIAGNOSIS — Z79899 Other long term (current) drug therapy: Secondary | ICD-10-CM

## 2022-03-08 LAB — CBC WITH DIFFERENTIAL/PLATELET
Abs Immature Granulocytes: 0.04 10*3/uL (ref 0.00–0.07)
Basophils Absolute: 0 10*3/uL (ref 0.0–0.1)
Basophils Relative: 0 %
Eosinophils Absolute: 0 10*3/uL (ref 0.0–0.5)
Eosinophils Relative: 0 %
HCT: 38.4 % (ref 36.0–46.0)
Hemoglobin: 12.9 g/dL (ref 12.0–15.0)
Immature Granulocytes: 0 %
Lymphocytes Relative: 16 %
Lymphs Abs: 1.7 10*3/uL (ref 0.7–4.0)
MCH: 27.7 pg (ref 26.0–34.0)
MCHC: 33.6 g/dL (ref 30.0–36.0)
MCV: 82.6 fL (ref 80.0–100.0)
Monocytes Absolute: 0.9 10*3/uL (ref 0.1–1.0)
Monocytes Relative: 9 %
Neutro Abs: 8 10*3/uL — ABNORMAL HIGH (ref 1.7–7.7)
Neutrophils Relative %: 75 %
Platelets: 245 10*3/uL (ref 150–400)
RBC: 4.65 MIL/uL (ref 3.87–5.11)
RDW: 14 % (ref 11.5–15.5)
WBC: 10.7 10*3/uL — ABNORMAL HIGH (ref 4.0–10.5)
nRBC: 0 % (ref 0.0–0.2)

## 2022-03-08 MED ORDER — LIDOCAINE-EPINEPHRINE 2 %-1:100000 IJ SOLN
10.0000 mL | Freq: Once | INTRAMUSCULAR | Status: AC
  Administered 2022-03-09: 10 mL
  Filled 2022-03-08: qty 1

## 2022-03-08 MED ORDER — FENTANYL CITRATE PF 50 MCG/ML IJ SOSY
100.0000 ug | PREFILLED_SYRINGE | Freq: Once | INTRAMUSCULAR | Status: AC
  Administered 2022-03-08: 100 ug via INTRAVENOUS
  Filled 2022-03-08: qty 2

## 2022-03-08 NOTE — ED Triage Notes (Signed)
Patient having left knee pain for 4 days. Has arthritis. She said she tried voltaren, but did not help with her pain. Unable to bear weight on the knee.

## 2022-03-08 NOTE — ED Provider Triage Note (Signed)
Emergency Medicine Provider Triage Evaluation Note  Anna Jenkins , a 66 y.o. female  was evaluated in triage.  Pt complains of left knee pain, no injury, worse bearing weight. On going problem, previously seen by ortho but experienced homelessness and unable to follow up. Applying voltaren without relief.  Review of Systems  Positive: As above Negative: As above  Physical Exam  BP 135/86 (BP Location: Right Arm)   Pulse 96   Temp 98.5 F (36.9 C) (Oral)   Resp 17   Ht 5\' 6"  (1.676 m)   Wt 74 kg   SpO2 99%   BMI 26.33 kg/m  Gen:   Awake, no distress   Resp:  Normal effort  MSK:   Left knee swelling, no erythema, DP pulse present  Other:    Medical Decision Making  Medically screening exam initiated at 5:54 PM.  Appropriate orders placed.  Anna Jenkins was informed that the remainder of the evaluation will be completed by another provider, this initial triage assessment does not replace that evaluation, and the importance of remaining in the ED until their evaluation is complete.     Tacy Learn, PA-C 03/08/22 1755

## 2022-03-08 NOTE — ED Provider Notes (Signed)
[facility]-EMERGENCY DEPT Provider Note   CSN: [id number] Arrival date & time: 03/08/22  1731     History  Chief Complaint  Patient presents with   Knee Pain    Anna Jenkins is a 66 y.o. female.  Pt is a 66 yo female presenting for knee pain. Pt admits to previous minor injuries to the left knee with pain developing in 2019. Pt states she was diagnosed with osteoarthritis. Pt states she recently dealt with homelessness, is no longer homeless, but recently had to pack up and move homes. Admits to worsening of her baseline left knee pain with associated warmth and swelling. Admits to inability to ambulate due to pain. Patient also admits to light headedness, generalized weakness, chills, and decreased appetite associated with knee pain.   The history is provided by the patient. No language interpreter was used.  Knee Pain Associated symptoms: no back pain and no fever        Home Medications Prior to Admission medications   Medication Sig Start Date End Date Taking? Authorizing Provider  Amlodipine-Olmesartan (AZOR PO) Take 20 mg by mouth daily.   Yes [provider]  buPROPion (WELLBUTRIN) 75 MG tablet Take 75 mg by mouth as needed.   Yes [provider]  diclofenac sodium (VOLTAREN) 1 % GEL Apply 2 g topically 4 (four) times daily. 06/05/17  Yes Kerrin Champagne, MD  XANAX 2 MG tablet Take 2 mg by mouth 2 (two) times daily as needed for sleep or anxiety. 03/02/22  Yes [provider]      Allergies    Codeine    Review of Systems   Review of Systems  Constitutional:  Negative for chills and fever.  HENT:  Negative for ear pain and sore throat.   Eyes:  Negative for pain and visual disturbance.  Respiratory:  Negative for cough and shortness of breath.   Cardiovascular:  Negative for chest pain and palpitations.  Gastrointestinal:  Negative for abdominal pain and vomiting.  Genitourinary:  Negative for dysuria and  hematuria.  Musculoskeletal:  Positive for gait problem and joint swelling. Negative for arthralgias and back pain.  Skin:  Negative for color change and rash.  Neurological:  Negative for seizures and syncope.  All other systems reviewed and are negative.   Physical Exam Updated Vital Signs BP 135/74   Pulse 77   Temp 98.4 F (36.9 C)   Resp 15   Ht 5\' 6"  (1.676 m)   Wt 74 kg   SpO2 99%   BMI 26.33 kg/m  Physical Exam Vitals and nursing note reviewed.  Constitutional:      General: She is not in acute distress.    Appearance: She is well-developed.  HENT:     Head: Normocephalic and atraumatic.  Eyes:     Conjunctiva/sclera: Conjunctivae normal.  Cardiovascular:     Rate and Rhythm: Normal rate and regular rhythm.     Pulses:          Dorsalis pedis pulses are 2+ on the right side and 2+ on the left side.     Heart sounds: No murmur heard. Pulmonary:     Effort: Pulmonary effort is normal. No respiratory distress.     Breath sounds: Normal breath sounds.  Abdominal:     Palpations: Abdomen is soft.     Tenderness: There is no abdominal tenderness.  Musculoskeletal:        General: No swelling.     Cervical  back: Neck supple.     Right hip: Normal.     Left hip: Normal.     Right upper leg: Normal.     Left upper leg: Normal.     Right knee: Normal.     Left knee: Swelling, effusion and bony tenderness present. No erythema, ecchymosis or lacerations. Tenderness present.     Right lower leg: Normal.     Left lower leg: Normal.     Right ankle: Normal.     Left ankle: Normal.  Skin:    General: Skin is warm and dry.     Capillary Refill: Capillary refill takes less than 2 seconds.  Neurological:     Mental Status: She is alert and oriented to person, place, and time.     GCS: GCS eye subscore is 4. GCS verbal subscore is 5. GCS motor subscore is 6.     Sensory: Sensation is intact.     Motor: Motor function is intact.  Psychiatric:        Mood and Affect:  Mood normal.     ED Results / Procedures / Treatments   Labs (all labs ordered are listed, but only abnormal results are displayed) Labs Reviewed  CBC WITH DIFFERENTIAL/PLATELET - Abnormal; Notable for the following components:      Result Value   WBC 10.7 (*)    Neutro Abs 8.0 (*)    All other components within normal limits  BASIC METABOLIC PANEL - Abnormal; Notable for the following components:   Potassium 2.2 (*)    Chloride 95 (*)    CO2 33 (*)    Glucose, Bld 115 (*)    BUN 7 (*)    Calcium 8.7 (*)    All other components within normal limits  BODY FLUID CULTURE W GRAM STAIN  ANAEROBIC CULTURE W GRAM STAIN  SEDIMENTATION RATE  C-REACTIVE PROTEIN  SYNOVIAL CELL COUNT + DIFF, W/ CRYSTALS    EKG None  Radiology DG Knee Complete 4 Views Left  Result Date: 03/08/2022 CLINICAL DATA:  Swelling of the knee for 4 days. 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 No definitive visible fracture or sign of dislocation but there is a large joint effusion which up lifts the patella. IMPRESSION: 1. Large suprapatellar effusion should be correlated with any history of trauma or infection. No visible fracture. 2. Degenerative changes about the knee. Electronically Signed   By: Donzetta Kohut M.D.   On: 03/08/2022 18:21    Procedures .Joint Aspiration/Arthrocentesis  Date/Time: 03/09/2022 12:46 AM  Performed by: Franne Forts, DO Authorized by: Franne Forts, DO   Consent:    Consent obtained:  Written   Consent given by:  Patient   Risks, benefits, and alternatives were discussed: yes     Risks discussed:  Bleeding, infection, pain, incomplete drainage and nerve damage   Alternatives discussed:  No treatment Universal protocol:    Immediately prior to procedure, a time out was called: yes     Patient identity confirmed:  Verbally with patient, arm band  and provided demographic data Location:    Location:  Knee   Knee:  L knee Anesthesia:    Anesthesia method:  Local infiltration Procedure details:    Preparation: Patient was prepped and draped in usual sterile fashion     Needle gauge:  18 G   Ultrasound guidance: no  Approach:  Lateral   Aspirate characteristics:  Blood-tinged   Steroid injected: no     Specimen collected: no   Post-procedure details:    Dressing:  Adhesive bandage   Procedure completion:  Tolerated well, no immediate complications     Medications Ordered in ED Medications  potassium chloride SA (KLOR-CON M) CR tablet 40 mEq (has no administration in time range)  potassium chloride 10 mEq in 100 mL IVPB (has no administration in time range)  fentaNYL (SUBLIMAZE) injection 100 mcg (100 mcg Intravenous Given 03/08/22 2327)  lidocaine-EPINEPHrine (XYLOCAINE W/EPI) 2 %-1:100000 (with pres) injection 10 mL (10 mLs Infiltration Given 03/09/22 0025)    ED Course/ Medical Decision Making/ A&P                           Medical Decision Making Amount and/or Complexity of Data Reviewed Labs: ordered.  Risk Prescription drug management.   12:46 AM  66 yo female presenting for knee pain.  Is alert and oriented x3, no acute distress, afebrile, stable vital signs.  Physical exam demonstrates joint effusion and warmth of the left knee.  Patient has pain with passive range of motion.  At this time I am unable to identify joint effusion from osteoarthritis versus septic joint.  Blood work is pending including a CBC, sed rate, and CRP.  Risks versus benefits of arthrocentesis explained in detail including risk for infection, failure of procedure, bleeding, nerve damage, ect.  Arthrocentesis performed and synovial fluid sent for analysis. No acute complications from procedure.   Pt signed out to oncoming physician while awaiting results.         Final Clinical Impression(s) / ED Diagnoses Final diagnoses:   Acute pain of left knee  Effusion of left knee    Rx / DC Orders ED Discharge Orders     None         Franne Forts, DO 03/09/22 5621

## 2022-03-09 ENCOUNTER — Encounter (HOSPITAL_COMMUNITY): Payer: Self-pay | Admitting: Internal Medicine

## 2022-03-09 DIAGNOSIS — I10 Essential (primary) hypertension: Secondary | ICD-10-CM | POA: Diagnosis present

## 2022-03-09 DIAGNOSIS — E876 Hypokalemia: Secondary | ICD-10-CM | POA: Diagnosis not present

## 2022-03-09 DIAGNOSIS — M25462 Effusion, left knee: Secondary | ICD-10-CM | POA: Diagnosis present

## 2022-03-09 LAB — BASIC METABOLIC PANEL
Anion gap: 9 (ref 5–15)
Anion gap: 6 (ref 5–15)
Anion gap: 9 (ref 5–15)
BUN: 7 mg/dL — ABNORMAL LOW (ref 8–23)
BUN: 8 mg/dL (ref 8–23)
BUN: 10 mg/dL (ref 8–23)
CO2: 33 mmol/L — ABNORMAL HIGH (ref 22–32)
CO2: 27 mmol/L (ref 22–32)
CO2: 27 mmol/L (ref 22–32)
Calcium: 8.7 mg/dL — ABNORMAL LOW (ref 8.9–10.3)
Calcium: 7.3 mg/dL — ABNORMAL LOW (ref 8.9–10.3)
Calcium: 8.3 mg/dL — ABNORMAL LOW (ref 8.9–10.3)
Chloride: 95 mmol/L — ABNORMAL LOW (ref 98–111)
Chloride: 104 mmol/L (ref 98–111)
Chloride: 99 mmol/L (ref 98–111)
Creatinine, Ser: 0.75 mg/dL (ref 0.44–1.00)
Creatinine, Ser: 0.6 mg/dL (ref 0.44–1.00)
Creatinine, Ser: 0.66 mg/dL (ref 0.44–1.00)
GFR, Estimated: >60 mL/min (ref >60)
GFR, Estimated: >60 mL/min (ref >60)
GFR, Estimated: >60 mL/min (ref >60)
Glucose, Bld: 115 mg/dL — ABNORMAL HIGH (ref 70–99)
Glucose, Bld: 102 mg/dL — ABNORMAL HIGH (ref 70–99)
Glucose, Bld: 117 mg/dL — ABNORMAL HIGH (ref 70–99)
Potassium: 2.2 mmol/L — CL (ref 3.5–5.1)
Potassium: 2.2 mmol/L — CL (ref 3.5–5.1)
Potassium: 3.4 mmol/L — ABNORMAL LOW (ref 3.5–5.1)
Sodium: 137 mmol/L (ref 135–145)
Sodium: 137 mmol/L (ref 135–145)
Sodium: 135 mmol/L (ref 135–145)

## 2022-03-09 LAB — PHOSPHORUS: Phosphorus: 2.2 mg/dL — ABNORMAL LOW (ref 2.5–4.6)

## 2022-03-09 LAB — SYNOVIAL CELL COUNT + DIFF, W/ CRYSTALS
Crystals, Fluid: NONE SEEN
Lymphocytes-Synovial Fld: 1 % (ref 0–20)
Monocyte-Macrophage-Synovial Fluid: 4 % — ABNORMAL LOW (ref 50–90)
Neutrophil, Synovial: 95 % — ABNORMAL HIGH (ref 0–25)
WBC, Synovial: 930 /mm3 — ABNORMAL HIGH (ref 0–200)

## 2022-03-09 LAB — HEPATIC FUNCTION PANEL
ALT: 11 U/L (ref 0–44)
AST: 16 U/L (ref 15–41)
Albumin: 2.9 g/dL — ABNORMAL LOW (ref 3.5–5.0)
Alkaline Phosphatase: 48 U/L (ref 38–126)
Bilirubin, Direct: 0.2 mg/dL (ref 0.0–0.2)
Indirect Bilirubin: 0.8 mg/dL (ref 0.3–0.9)
Total Bilirubin: 1 mg/dL (ref 0.3–1.2)
Total Protein: 6.5 g/dL (ref 6.5–8.1)

## 2022-03-09 LAB — C-REACTIVE PROTEIN: CRP: 19.5 mg/dL — ABNORMAL HIGH (ref <1.0)

## 2022-03-09 LAB — SEDIMENTATION RATE: Sed Rate: 53 mm/hr — ABNORMAL HIGH (ref 0–22)

## 2022-03-09 LAB — MAGNESIUM: Magnesium: 1.8 mg/dL (ref 1.7–2.4)

## 2022-03-09 MED ORDER — ORAL CARE MOUTH RINSE: 15.0000 mL | OROMUCOSAL | Status: DC | PRN

## 2022-03-09 MED ORDER — AMLODIPINE BESYLATE 5 MG PO TABS
5.0000 mg | ORAL_TABLET | Freq: Every day | ORAL | Status: DC
  Filled 2022-03-09 (×2): qty 1

## 2022-03-09 MED ORDER — POTASSIUM CHLORIDE 10 MEQ/100ML IV SOLN
10.0000 meq | INTRAVENOUS | Status: AC
  Administered 2022-03-09 (×3): 10 meq via INTRAVENOUS
  Filled 2022-03-09 (×3): qty 100

## 2022-03-09 MED ORDER — POTASSIUM CITRATE ER 10 MEQ (1080 MG) PO TBCR
20.0000 meq | EXTENDED_RELEASE_TABLET | Freq: Three times a day (TID) | ORAL | Status: DC
  Filled 2022-03-09: qty 2

## 2022-03-09 MED ORDER — ACETAMINOPHEN 650 MG RE SUPP: 650.0000 mg | Freq: Four times a day (QID) | RECTAL | Status: DC | PRN

## 2022-03-09 MED ORDER — MAGNESIUM SULFATE 2 GM/50ML IV SOLN
2.0000 g | Freq: Once | INTRAVENOUS | Status: AC
  Administered 2022-03-09: 2 g via INTRAVENOUS
  Filled 2022-03-09: qty 50

## 2022-03-09 MED ORDER — ACETAMINOPHEN 325 MG PO TABS
650.0000 mg | ORAL_TABLET | Freq: Four times a day (QID) | ORAL | Status: DC | PRN
  Administered 2022-03-09 – 2022-03-12 (×3): 650 mg via ORAL
  Filled 2022-03-09 (×3): qty 2

## 2022-03-09 MED ORDER — FENTANYL CITRATE PF 50 MCG/ML IJ SOSY
50.0000 ug | PREFILLED_SYRINGE | Freq: Once | INTRAMUSCULAR | Status: AC
  Administered 2022-03-09: 50 ug via INTRAVENOUS
  Filled 2022-03-09: qty 1

## 2022-03-09 MED ORDER — POTASSIUM CHLORIDE CRYS ER 20 MEQ PO TBCR: 40.0000 meq | EXTENDED_RELEASE_TABLET | Freq: Two times a day (BID) | ORAL | Status: DC

## 2022-03-09 MED ORDER — POTASSIUM CHLORIDE 20 MEQ PO PACK
40.0000 meq | PACK | Freq: Once | ORAL | Status: AC
  Administered 2022-03-09: 40 meq via ORAL
  Filled 2022-03-09: qty 2

## 2022-03-09 MED ORDER — POTASSIUM CHLORIDE 10 MEQ/100ML IV SOLN
10.0000 meq | INTRAVENOUS | Status: AC
  Administered 2022-03-09 – 2022-03-10 (×6): 10 meq via INTRAVENOUS
  Filled 2022-03-09 (×6): qty 100

## 2022-03-09 MED ORDER — ENOXAPARIN SODIUM 40 MG/0.4ML IJ SOSY
40.0000 mg | PREFILLED_SYRINGE | INTRAMUSCULAR | Status: DC
  Administered 2022-03-10: 40 mg via SUBCUTANEOUS
  Filled 2022-03-09 (×2): qty 0.4

## 2022-03-09 MED ORDER — ONDANSETRON HCL 4 MG PO TABS: 4.0000 mg | ORAL_TABLET | Freq: Four times a day (QID) | ORAL | Status: DC | PRN

## 2022-03-09 MED ORDER — POTASSIUM CHLORIDE CRYS ER 20 MEQ PO TBCR: 40.0000 meq | EXTENDED_RELEASE_TABLET | Freq: Three times a day (TID) | ORAL | Status: DC

## 2022-03-09 MED ORDER — POTASSIUM CHLORIDE CRYS ER 20 MEQ PO TBCR
40.0000 meq | EXTENDED_RELEASE_TABLET | Freq: Once | ORAL | Status: DC
  Filled 2022-03-09: qty 2

## 2022-03-09 MED ORDER — POTASSIUM CHLORIDE 10 MEQ/100ML IV SOLN
10.0000 meq | INTRAVENOUS | Status: AC
  Administered 2022-03-09 (×2): 10 meq via INTRAVENOUS
  Filled 2022-03-09 (×2): qty 100

## 2022-03-09 MED ORDER — ONDANSETRON HCL 4 MG/2ML IJ SOLN: 4.0000 mg | Freq: Four times a day (QID) | INTRAMUSCULAR | Status: DC | PRN

## 2022-03-09 MED ORDER — AMLODIPINE-OLMESARTAN 5-20 MG PO TABS: 1.0000 | ORAL_TABLET | Freq: Every day | ORAL | Status: DC

## 2022-03-09 MED ORDER — OXYCODONE HCL 5 MG PO TABS
5.0000 mg | ORAL_TABLET | Freq: Four times a day (QID) | ORAL | Status: DC | PRN
  Administered 2022-03-09 – 2022-03-13 (×7): 5 mg via ORAL
  Filled 2022-03-09 (×7): qty 1

## 2022-03-09 MED ORDER — IRBESARTAN 150 MG PO TABS
150.0000 mg | ORAL_TABLET | Freq: Every day | ORAL | Status: DC
  Filled 2022-03-09 (×3): qty 1

## 2022-03-09 MED ORDER — ACETAMINOPHEN 500 MG PO TABS: 1000.0000 mg | ORAL_TABLET | Freq: Once | ORAL | Status: DC

## 2022-03-09 MED ORDER — K PHOS MONO-SOD PHOS DI & MONO 155-852-130 MG PO TABS
500.0000 mg | ORAL_TABLET | Freq: Three times a day (TID) | ORAL | Status: DC
  Administered 2022-03-09: 500 mg via ORAL
  Filled 2022-03-09 (×3): qty 2

## 2022-03-09 MED ORDER — POTASSIUM CHLORIDE IN NACL 20-0.9 MEQ/L-% IV SOLN
INTRAVENOUS | Status: DC
  Filled 2022-03-09 (×2): qty 1000

## 2022-03-09 MED ORDER — POTASSIUM CHLORIDE 10 MEQ/100ML IV SOLN
10.0000 meq | INTRAVENOUS | Status: DC
  Administered 2022-03-09: 10 meq via INTRAVENOUS
  Filled 2022-03-09: qty 100

## 2022-03-09 NOTE — ED Provider Notes (Signed)
I assumed care of this patient.  Please see previous provider note for further details of Hx, PE.  Briefly patient is a 66 y.o. female who presented left knee pain with large effusion pending syncovial labs.  Pain improved after arthrocentesis. Patient also provided with pain medicine  Synovial fluid not concerning for septic arthritis.  More consistent with inflammatory arthritis.  No crystals. Provided with a knee brace and crutches. Recommend PCP/Ortho follow-up  During work-up patient also noted to be hypokalemic pending IV and oral repletion. Recommend PCP follow-up  The patient appears reasonably screened and/or stabilized for discharge and I doubt any other medical condition or other [facility] requiring further screening, evaluation, or treatment in the ED at this time. I have discussed the findings, Dx and Tx plan with the patient/family who expressed understanding and agree(s) with the plan. Discharge instructions discussed at length. The patient/family was given strict return precautions who verbalized understanding of the instructions. No further questions at time of discharge.  Disposition: Discharge  Condition: Good  ED Discharge Orders     None        Follow Up: Nolene Ebbs, MD [address] [place] [address] [phone]  Call  to schedule an appointment for close follow up  Orthopedic surgery  Call  to schedule an appointment for close follow up           Makell Drohan, Grayce Sessions, MD 03/09/22 [phone]

## 2022-03-09 NOTE — ED Provider Notes (Signed)
Patient was discharged by prior team.  She presented with some left knee pain and swelling.  Synovial fluid was aspirated and is not consistent with infection.  She was noted to be hypokalemic with potassium of 2.2.  She reports that she has weakness in her legs.  She feels like it is related to her knees.  She now has pain in her right knee.  There is some mild swelling of the right knee but no warmth or erythema.  No recent falls that would warrant acute imaging.  She does not have any back pain or weakness that I feel is radicular in nature.  It sounds like her difficulty moving her legs is related to her knee pain.  However her potassium is low despite treatment in the ED.  This was rechecked and it still 2.2.  Magnesium level was ordered.  Patient was started on some additional potassium supplementation.  I spoke with Dr. Olevia Bowens who will admit the patient for further treatment.   Anna Johns, MD 03/09/22 1149

## 2022-03-09 NOTE — Discharge Instructions (Addendum)
For pain control you may take at 1000 mg of Tylenol every 8 hours as needed. 

## 2022-03-09 NOTE — H&P (Signed)
History and Physical    Patient: Anna Jenkins [id number] DOB: 1955/12/23 DOA: 03/08/2022 DOS: the patient was seen and examined on 03/09/2022 PCP: Nolene Ebbs, MD  Patient coming from: Home  Chief Complaint:  Chief Complaint  Patient presents with   Knee Pain   HPI: Anna Jenkins is a 66 y.o. female with medical history significant of anxiety, chronic fatigue syndrome, hypertension, trophoblastic disease who came in yesterday to the emergency department complaining of left knee pain and edema.  No fever or chills.  She had an arthrocentesis sustaining significant improvement.  The plan was to initially discharge her home with outpatient follow-up with orthopedic surgery, but she was also severely hypokalemic so we were asked to admit for cardiac monitoring and potassium replacement.  No fever, chills or night sweats. No sore throat, rhinorrhea, dyspnea, wheezing or hemoptysis.  No chest pain, palpitations, diaphoresis, PND, orthopnea or pitting edema of the lower extremities.  No abdominal pain, diarrhea,  melena or hematochezia.  She gets frequently constipated and her appetite is decreased..  No flank pain, dysuria, frequency or hematuria.  No polyuria, polydipsia, polyphagia or blurred vision.  ED course: Initial vital signs were temperature 98.5 F, pulse 96, respiration 17, BP 135/86 mmHg O2 sat 99% on room air.  The patient received fentanyl 50 mcg IVP and KCl 40 mEq p.o. x1.  Lab work: Her CBC is her white count 10.7, Mono 12.9 g/dL platelets 245.  Sed rate was 53 mm/h.  BMP showed a sodium 137, potassium 2.2, chloride 95 and CO2 33 mmol/L with a normal anion gap.  Glucose 115, BUN 7, creatinine 0.75 and calcium 8.7 mg/dL.  CRP was 19.5 mg/dL.  Synovial fluid analysis consistent with inflammatory and not septic arthritis.  Imaging: Left knee x-ray with large suprapatellar effusion, but no visible fracture.  There were degenerative changes about the knee.  Review of Systems:  As mentioned in the history of present illness. All other systems reviewed and are negative.  Past Medical History:  Diagnosis Date   Anxiety    Chronic fatigue syndrome    Hypertension    Trophoblastic disease    Past Surgical History:  Procedure Laterality Date   PILONIDAL CYST EXCISION     Social History:  reports that she quit smoking about 29 years ago. She has never used smokeless tobacco. She reports that she does not drink alcohol and does not use drugs.  Allergies  Allergen Reactions   Codeine     Flu-like symptoms    History reviewed. No pertinent family history.  Prior to Admission medications   Medication Sig Start Date End Date Taking? Authorizing Provider  Amlodipine-Olmesartan (AZOR PO) Take 20 mg by mouth daily.   Yes [provider]  buPROPion (WELLBUTRIN) 75 MG tablet Take 75 mg by mouth as needed.   Yes [provider]  diclofenac sodium (VOLTAREN) 1 % GEL Apply 2 g topically 4 (four) times daily. 06/05/17  Yes Jessy Oto, MD  XANAX 2 MG tablet Take 2 mg by mouth 2 (two) times daily as needed for sleep or anxiety. 03/02/22  Yes [provider]    Physical Exam: Vitals:   03/09/22 0647 03/09/22 0900 03/09/22 1000 03/09/22 1015  BP:  138/81 128/75 135/79  Pulse:  81 84 81  Resp:  14 15 14   Temp: 98.3 F (36.8 C)  98.2 F (36.8 C)   TempSrc: Oral  Oral   SpO2:  99% 100% 100%  Weight:  Height:       Physical Exam Vitals and nursing note reviewed.  Constitutional:      General: She is awake. She is not in acute distress.    Appearance: Normal appearance.  HENT:     Head: Normocephalic.     Nose: No rhinorrhea.     Mouth/Throat:     Mouth: Mucous membranes are moist.  Eyes:     General: No scleral icterus.    Pupils: Pupils are equal, round, and reactive to light.  Neck:     Vascular: No JVD.  Cardiovascular:     Rate and Rhythm: Normal rate and regular rhythm.     Heart sounds: S1 normal and S2 normal.   Pulmonary:     Effort: Pulmonary effort is normal.     Breath sounds: Normal breath sounds. No wheezing, rhonchi or rales.  Abdominal:     Palpations: Abdomen is soft.     Tenderness: There is no abdominal tenderness.  Musculoskeletal:     Cervical back: Neck supple.     Right lower leg: No edema.     Left lower leg: No edema.  Skin:    General: Skin is warm and dry.  Neurological:     General: No focal deficit present.     Mental Status: She is alert and oriented to person, place, and time.  Psychiatric:        Mood and Affect: Mood is anxious.     Comments: Mildly anxious mood.   Data Reviewed:  Results are pending, will review when available.  Assessment and Plan: Principal Problem:   Hypokalemia Observation/telemetry. Trying to replace. However, patient stated she is allergic to oral KCl. If available, I will order potassium citrate. Continue parenteral KCl replacement. Follow-up potassium level this evening. Follow-up potassium level in the morning.  Active Problems:   Hypophosphatemia K-Phos ordered. Follow-up level as needed.    Hypocalcemia Likely vitamin D deficient. We will check vitamin D level.    Hypertension Continue amlodipine 5 mg p.o. daily. Continue olmesartan 20 mg p.o. daily. Monitor BP, heart rate, renal function electrolytes.    Effusion of left knee Improved after arthrocentesis. Continue analgesics as needed. Follow-up with orthopedic surgery as an outpatient.     Advance Care Planning:   Code Status: Full Code   Consults:   Family Communication:   Severity of Illness: The appropriate patient status for this patient is OBSERVATION. Observation status is judged to be reasonable and necessary in order to provide the required intensity of service to ensure the patient's safety. The patient's presenting symptoms, physical exam findings, and initial radiographic and laboratory data in the context of their medical condition is felt to  place them at decreased risk for further clinical deterioration. Furthermore, it is anticipated that the patient will be medically stable for discharge from the hospital within 2 midnights of admission.   Author: Reubin Milan, MD 03/09/2022 11:58 AM  For on call review [email or web address].   This document was prepared using Dragon voice recognition software and may contain some unintended transcription errors.

## 2022-03-09 NOTE — Progress Notes (Signed)
Orthopedic Tech Progress Note Patient Details:  Anna Jenkins November 18, 1955 [id number]  Patient ID: [name], female   DOB: 1956-01-06, 66 y.o.   MRN: [id number]  Kennis Carina 03/09/2022, 7:07 PM After speaking to ER Dr the patient symptoms changed and was admitted. Waiting on a PT eval before brace is applied. Patient has own walker and cane at home that she sometimes uses. Also complaining about both legs hurting. Discussed with RN . Will revisit in morning.

## 2022-03-10 DIAGNOSIS — R262 Difficulty in walking, not elsewhere classified: Secondary | ICD-10-CM

## 2022-03-10 DIAGNOSIS — I10 Essential (primary) hypertension: Secondary | ICD-10-CM | POA: Diagnosis not present

## 2022-03-10 DIAGNOSIS — M25462 Effusion, left knee: Secondary | ICD-10-CM

## 2022-03-10 DIAGNOSIS — E876 Hypokalemia: Secondary | ICD-10-CM | POA: Diagnosis not present

## 2022-03-10 LAB — COMPREHENSIVE METABOLIC PANEL
ALT: 13 U/L (ref 0–44)
AST: 20 U/L (ref 15–41)
Albumin: 2.6 g/dL — ABNORMAL LOW (ref 3.5–5.0)
Alkaline Phosphatase: 47 U/L (ref 38–126)
Anion gap: 4 — ABNORMAL LOW (ref 5–15)
BUN: 13 mg/dL (ref 8–23)
CO2: 26 mmol/L (ref 22–32)
Calcium: 8 mg/dL — ABNORMAL LOW (ref 8.9–10.3)
Chloride: 105 mmol/L (ref 98–111)
Creatinine, Ser: 0.71 mg/dL (ref 0.44–1.00)
GFR, Estimated: >60 mL/min (ref >60)
Glucose, Bld: 121 mg/dL — ABNORMAL HIGH (ref 70–99)
Potassium: 4.6 mmol/L (ref 3.5–5.1)
Sodium: 135 mmol/L (ref 135–145)
Total Bilirubin: 0.6 mg/dL (ref 0.3–1.2)
Total Protein: 6.4 g/dL — ABNORMAL LOW (ref 6.5–8.1)

## 2022-03-10 LAB — CBC
HCT: 34.5 % — ABNORMAL LOW (ref 36.0–46.0)
Hemoglobin: 11.4 g/dL — ABNORMAL LOW (ref 12.0–15.0)
MCH: 28.1 pg (ref 26.0–34.0)
MCHC: 33 g/dL (ref 30.0–36.0)
MCV: 85.2 fL (ref 80.0–100.0)
Platelets: 241 10*3/uL (ref 150–400)
RBC: 4.05 MIL/uL (ref 3.87–5.11)
RDW: 14.6 % (ref 11.5–15.5)
WBC: 9.2 10*3/uL (ref 4.0–10.5)
nRBC: 0 % (ref 0.0–0.2)

## 2022-03-10 LAB — HIV ANTIBODY (ROUTINE TESTING W REFLEX): HIV Screen 4th Generation wRfx: NONREACTIVE

## 2022-03-10 MED ORDER — KETOROLAC TROMETHAMINE 30 MG/ML IJ SOLN
30.0000 mg | Freq: Once | INTRAMUSCULAR | Status: AC
  Administered 2022-03-10: 30 mg via INTRAVENOUS
  Filled 2022-03-10: qty 1

## 2022-03-10 MED ORDER — KETOROLAC TROMETHAMINE 30 MG/ML IJ SOLN
15.0000 mg | Freq: Four times a day (QID) | INTRAMUSCULAR | Status: AC | PRN
  Administered 2022-03-12: 15 mg via INTRAVENOUS
  Filled 2022-03-10: qty 1

## 2022-03-10 MED ORDER — DICLOFENAC SODIUM 1 % EX GEL
2.0000 g | Freq: Four times a day (QID) | CUTANEOUS | Status: DC
  Administered 2022-03-10 – 2022-03-11 (×5): 2 g via TOPICAL
  Filled 2022-03-10: qty 100

## 2022-03-10 MED ORDER — PANTOPRAZOLE SODIUM 40 MG PO TBEC
40.0000 mg | DELAYED_RELEASE_TABLET | Freq: Every day | ORAL | Status: DC
  Administered 2022-03-10 – 2022-03-13 (×4): 40 mg via ORAL
  Filled 2022-03-10 (×4): qty 1

## 2022-03-10 MED ORDER — ALPRAZOLAM 0.5 MG PO TABS
2.0000 mg | ORAL_TABLET | Freq: Once | ORAL | Status: AC
  Administered 2022-03-11: 2 mg via ORAL
  Filled 2022-03-10: qty 4

## 2022-03-10 NOTE — Evaluation (Signed)
Physical Therapy Evaluation Patient Details Name: Anna Jenkins MRN: [id number] DOB: 1956-04-26 Today's Date: 03/10/2022  History of Present Illness  Anna Jenkins is a 66 y.o. female presents with L knee pain; admitted with hypokalemia. Left knee x-ray with large suprapatellar effusion, but no visible fracture. PMH: anxiety, chronic fatigue syndrome, HTN, trophoblastic disease  Clinical Impression  Pt admitted with above diagnosis. Pt from home alone using no AD at baseline, reports recently holding onto walls and furniture to ambulate due to bil knee pain L>R, also reports she recently moved. Pt able to participate in AROM testing but needing significant increased time to complete motions. Pt currently needing significantly increased time for mobility, min A with bed mobility and heavy verbal cues and encouragement. Pt needing min to mod A+2 for safety with STS and stand pivot to [facility]. Pt then able to ambulate to sink then back to recliner with increased time, good bil foot clearance and no knee buckling noted, but overall limited in distance by pain. Pt without assistance at home and needing significant assistance with bed mobility and transfers and limited ambulation, recommending SNF prior to return home alone due to assistance level and high risk for falls. Pt currently with functional limitations due to the deficits listed below (see PT Problem List). Pt will benefit from skilled PT to increase their independence and safety with mobility to allow discharge to the venue listed below.          Recommendations for follow up therapy are one component of a multi-disciplinary discharge planning process, led by the attending physician.  Recommendations may be updated based on patient status, additional functional criteria and insurance authorization.  Follow Up Recommendations Skilled nursing-short term rehab (<3 hours/day) Can patient physically be transported by private vehicle: Yes     Assistance Recommended at Discharge Frequent or constant Supervision/Assistance  Patient can return home with the following  A little help with walking and/or transfers;A little help with bathing/dressing/bathroom;Assistance with cooking/housework;Assist for transportation    Equipment Recommendations Other (comment) (TBD)  Recommendations for Other Services       Functional Status Assessment Patient has had a recent decline in their functional status and demonstrates the ability to make significant improvements in function in a reasonable and predictable amount of time.     Precautions / Restrictions Precautions Precautions: Fall Restrictions Weight Bearing Restrictions: No      Mobility  Bed Mobility Overal bed mobility: Needs Assistance Bed Mobility: Supine to Sit  Supine to sit: Min assist  General bed mobility comments: very slow to mobilize BLE to EOB, moving 1 leg at a time and self assisting at times with therapist assisting as pt fatigued, pulling on bedrail to upright trunk into sitting with min A to scoot to EOB with bedpad    Transfers Overall transfer level: Needs assistance Equipment used: Rolling walker (2 wheels) Transfers: Sit to/from Stand, Bed to chair/wheelchair/BSC Sit to Stand: Mod assist, Min assist, +2 safety/equipment  Step pivot transfers: Min assist, +2 safety/equipment  General transfer comment: mod-min A +2 for safety to power to stand, elevated bed slightly, pt prefers to pull from braced RW to power up, cues for bil quad/glute activation to rise, hip extension last, min A+2 for safety for step pivot to [facility], uncontrolled descent into recliner with single UE assisting    Ambulation/Gait Ambulation/Gait assistance: Min guard Gait Distance (Feet): 4 Feet Assistive device: Rolling walker (2 wheels) Gait Pattern/deviations: Step-to pattern, Decreased stride length, Narrow base of support  Gait velocity: decreased  General Gait Details: very short,  very slow steps from [facility] to sink to wash hands then to recliner, able to perform standing marching wtih RW and min guard for safety, no knee buckling noted and good bil foot clearance, limited by bil knee pain complaints and fatigue  Stairs            Wheelchair Mobility    Modified Rankin (Stroke Patients Only)       Balance Overall balance assessment: Needs assistance Sitting-balance support: Feet supported Sitting balance-Leahy Scale: Fair Sitting balance - Comments: seated EOB  Standing balance support: During functional activity, Bilateral upper extremity supported, Reliant on assistive device for balance Standing balance-Leahy Scale: Poor Standing balance comment: able to release hands to wash in sink and lean on counter, dynamic with RW        Pertinent Vitals/Pain Pain Assessment Pain Assessment: 0-10 Pain Score: 5  Pain Location: bil knee with movement Pain Descriptors / Indicators: Discomfort (shooting pain on L, "just hurts" on R) Pain Intervention(s): Limited activity within patient's tolerance, Monitored during session    Home Living Family/patient expects to be discharged to:: Private residence Living Arrangements: Alone Available Help at Discharge: Friend(s);Available PRN/intermittently Type of Home: House Home Access: Level entry  Home Layout: One level Home Equipment: None      Prior Function Prior Level of Function : Independent/Modified Independent;Driving  Mobility Comments: pt reports ind with community ambulation until ~3 days ago then holding onto furniture and walls until unable to walk ADLs Comments: pt reports ind     Hand Dominance        Extremity/Trunk Assessment   Upper Extremity Assessment Upper Extremity Assessment: Defer to OT evaluation    Lower Extremity Assessment Lower Extremity Assessment: RLE deficits/detail;LLE deficits/detail RLE Deficits / Details: ankle AROM WFL, knee AROM flexion >100 deg, hip strength grossly  3+/5 RLE Sensation: WNL RLE Coordination: WNL LLE Deficits / Details: ankle AROM WFL, knee AROM flexion ~100 deg,  hip strength grossly 3+/5 LLE Sensation: WNL LLE Coordination: WNL       Communication   Communication: No difficulties  Cognition Arousal/Alertness: Lethargic, Awake/alert Behavior During Therapy: Flat affect Overall Cognitive Status: No family/caregiver present to determine baseline cognitive functioning  General Comments: Pt reports being tired, able to awaken and have conversation but maintains eyes closed ~75% of the convo then opens then when mobiliznig but remains flat, a&o x4        General Comments      Exercises     Assessment/Plan    PT Assessment Patient needs continued PT services  PT Problem List Decreased strength;Decreased activity tolerance;Decreased balance;Decreased mobility;Decreased knowledge of use of DME;Decreased safety awareness;Pain       PT Treatment Interventions DME instruction;Gait training;Functional mobility training;Therapeutic activities;Therapeutic exercise;Balance training;Patient/family education    PT Goals (Current goals can be found in the Care Plan section)  Acute Rehab PT Goals Patient Stated Goal: regain strength PT Goal Formulation: With patient Time For Goal Achievement: 03/24/22 Potential to Achieve Goals: Good    Frequency Min 3X/week     Co-evaluation PT/OT/SLP Co-Evaluation/Treatment: Yes Reason for Co-Treatment: For patient/therapist safety;To address functional/ADL transfers PT goals addressed during session: Mobility/safety with mobility;Proper use of DME;Balance         AM-PAC PT "6 Clicks" Mobility  Outcome Measure Help needed turning from your back to your side while in a flat bed without using bedrails?: A Little Help needed moving from lying on your back to sitting  on the side of a flat bed without using bedrails?: A Little Help needed moving to and from a bed to a chair (including a  wheelchair)?: A Little Help needed standing up from a chair using your arms (e.g., wheelchair or bedside chair)?: A Little Help needed to walk in hospital room?: A Lot Help needed climbing 3-5 steps with a railing? : Total 6 Click Score: 15    End of Session Equipment Utilized During Treatment: Gait belt Activity Tolerance: Patient tolerated treatment well;Patient limited by pain Patient left: in chair;with call bell/phone within reach;with chair alarm set Nurse Communication: Mobility status PT Visit Diagnosis: Unsteadiness on feet (R26.81);Other abnormalities of gait and mobility (R26.89);Muscle weakness (generalized) (M62.81);Pain Pain - Right/Left:  (L>R) Pain - part of body: Knee    Time: 1500-1541 PT Time Calculation (min) (ACUTE ONLY): 41 min   Charges:   PT Evaluation $PT Eval Moderate Complexity: 1 Mod           Tori Laurier Jasperson PT, DPT 03/10/22, 3:54 PM

## 2022-03-10 NOTE — Progress Notes (Signed)
PROGRESS NOTE    Anna Jenkins  DXI:[id number] DOB: Sep 26, 1955 DOA: 03/08/2022 PCP: Fleet Contras, MD    Brief Narrative:  Anna Jenkins is a 66 y.o. 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  She did have arthrocentesis with some improvement and was discharged home to follow-up with orthopedic surgery but was severely hypokalemic so medical team was consulted for admission to the hospital.  Vitals were stable.  WBC was 10.7.  Potassium was 2.2.  Sed rate was 53.  synovial fluid analysis was consistent with inflammatory but not septic arthritis.  X-ray showed large suprapatellar effusion.  Patient stated that she was allergic to oral potassium KCl so parenteral KCl and potassium citrate's was initiated.  Assessment and plan  Principal Problem:   Hypokalemia Active Problems:   Hypophosphatemia   Hypocalcemia   Hypertension   Effusion of left knee   Ambulatory dysfunction   Hypokalemia Patient received IV potassium and potassium citrate.  Potassium has improved to 4.6 today.  Might benefit from continuation of potassium on discharge.     Hypophosphatemia Phosphorus level was 2.2.  Received K-Phos.    Hypocalcemia Vitamin D level was sent.     Hypertension Continue amlodipine olmesartan.  Blood pressure seems to be stable.     Effusion of left knee Improved after arthrocentesis.  Follow-up with orthopedic surgery as outpatient.  No evidence of sepsis from fluid analysis.  Effusion and pain of bilateral knees, ambulatory dysfunction, generalized weakness, debility, deconditioning.  Patient was seen by physical therapy and had profound weakness with ambulatory dysfunction.  Patient lives by herself at home and is unsafe for disposition at this time.  PT has recommended skilled nursing facility placement.      DVT prophylaxis: enoxaparin (LOVENOX) injection 40  mg Start: 03/09/22 2200   Code Status:     Code Status: Full Code  Disposition: Skilled nursing facility as per PT evaluation.  Patient lives by herself at home.  Status is: Observation The patient will require care spanning > 2 midnights and should be moved to inpatient because: Profound debility, pain, ambulatory dysfunction, unsafe disposition,   Family Communication: None at bedside  Consultants:  None  Procedures:  Joint aspiration  Antimicrobials:  None  Anti-infectives (From admission, onward)    None      Subjective: Today, patient was seen and examined at bedside.  Complains of bilateral knee pain and swelling and effusion.  Nursing staff reported extreme weakness.  PT evaluated the patient and needed assistance.  Objective: Vitals:   03/09/22 2111 03/10/22 0118 03/10/22 0505 03/10/22 1416  BP: 121/76 116/77 120/71 135/87  Pulse: 83 81 84 80  Resp: 16 16 16 17   Temp: 99.1 F (37.3 C) 99.3 F (37.4 C) 98.7 F (37.1 C) 98.7 F (37.1 C)  TempSrc: Oral Oral Oral Oral  SpO2: 97% 97% 93% 94%  Weight:      Height:        Intake/Output Summary (Last 24 hours) at 03/10/2022 1605 Last data filed at 03/09/2022 1803 Gross per 24 hour  Intake 522.74 ml  Output --  Net 522.74 ml   Filed Weights   03/08/22 1751  Weight: 74 kg    Physical Examination: Body mass index is 26.33 kg/m.  General:  Average built, not in obvious distress HENT:   No scleral pallor or icterus noted. Oral mucosa is moist.  Chest:  Clear  breath sounds.  Diminished breath sounds bilaterally. No crackles or wheezes.  CVS: S1 &S2 heard. No murmur.  Regular rate and rhythm. Abdomen: Soft, nontender, nondistended.  Bowel sounds are heard.   Extremities: No cyanosis, clubbing or edema.  Bilateral knee joint effusion with joint line tenderness.  No erythema, peripheral pulses are palpable. Psych: Alert, awake and oriented, anxious CNS:  No cranial nerve deficits.  Power equal in all  extremities.   Skin: Warm and dry.  No rashes noted.  Data Reviewed:   CBC: Recent Labs  Lab 03/08/22 2222 03/10/22 0557  WBC 10.7* 9.2  NEUTROABS 8.0*  --   HGB 12.9 11.4*  HCT 38.4 34.5*  MCV 82.6 85.2  PLT 245 734    Basic Metabolic Panel: Recent Labs  Lab 03/08/22 2222 03/09/22 0937 03/09/22 1923 03/10/22 0557  NA 137 137 135 135  K 2.2* 2.2* 3.4* 4.6  CL 95* 104 99 105  CO2 33* 27 27 26   GLUCOSE 115* 102* 117* 121*  BUN 7* 8 10 13   CREATININE 0.75 0.60 0.66 0.71  CALCIUM 8.7* 7.3* 8.3* 8.0*  MG  --  1.8  --   --   PHOS  --  2.2*  --   --     Liver Function Tests: Recent Labs  Lab 03/09/22 0937 03/10/22 0557  AST 16 20  ALT 11 13  ALKPHOS 48 47  BILITOT 1.0 0.6  PROT 6.5 6.4*  ALBUMIN 2.9* 2.6*     Radiology Studies: DG Knee Complete 4 Views Left  Result Date: 03/08/2022 CLINICAL DATA:  Swelling of the knee for 4 days. 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 No definitive visible fracture or sign of dislocation but there is a large joint effusion which up lifts the patella. IMPRESSION: 1. Large suprapatellar effusion should be correlated with any history of trauma or infection. No visible fracture. 2. Degenerative changes about the knee. Electronically Signed   By: Zetta Bills M.D.   On: 03/08/2022 18:21      LOS: 0 days    Flora Lipps, MD [facility] Available via Epic secure chat 7am-7pm After these hours, please refer to coverage provider listed on [email or web address] 03/10/2022, 4:05 PM

## 2022-03-10 NOTE — Hospital Course (Addendum)
Anna Jenkins is a 66 y.o. 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  She did have arthrocentesis with some improvement and was discharged home to follow-up with orthopedic surgery but was severely hypokalemic so medical team was consulted for admission to the hospital.  Vitals were stable.  WBC was 10.7.  Potassium was 2.2.  Sed rate was 53.  synovial fluid analysis was consistent with inflammatory but not septic arthritis.  X-ray showed large suprapatellar effusion.  Patient stated that she was allergic to oral potassium KCl so parenteral KCl and potassium citrate's was initiated.   Assessment and plan  Hypokalemia Patient received IV potassium and potassium citrate.  Potassium has improved to 4.6 today.  Might benefit from continuation of potassium on discharge.     Hypophosphatemia Phosphorus level was 2.2.  Received K-Phos.    Hypocalcemia Vitamin D level was sent.     Hypertension Continue amlodipine olmesartan.  Blood pressure seems to be stable.     Effusion of left knee Improved after arthrocentesis.  Follow-up with orthopedic surgery as outpatient.  No evidence of sepsis from fluid analysis.  Effusion and pain of bilateral knees, ambulatory dysfunction, generalized weakness, debility, deconditioning.  Patient was seen by physical therapy and had profound weakness with ambulatory dysfunction.  Patient lives by herself at home and is unsafe for disposition at this time.  PT has recommended skilled nursing facility placement.

## 2022-03-10 NOTE — Evaluation (Signed)
Occupational Therapy Evaluation Patient Details Name: Anna Jenkins MRN: [id number] DOB: 1955/06/27 Today's Date: 03/10/2022   History of Present Illness Anna Jenkins is a 66 y.o. female presents with L knee pain; admitted with hypokalemia. Left knee x-ray with large suprapatellar effusion, but no visible fracture. PMH: anxiety, chronic fatigue syndrome, HTN, trophoblastic disease   Clinical Impression   Anna Jenkins is a 66 year old woman admitted to hospital with above medical history and presents with decreased activity tolerance, impaired balance and bilateral knee pain. Patient needing min-mod assist to power up, min assist to take steps in room with walker and increased assistance with ADLs compared to her baseline. +2 required for safety during evaluation. She typically lives alone and independent.  Patient will benefit from skilled OT services while in hospital to improve deficits and learn compensatory strategies as needed in order to return to PLOF.  Current plan is rehab if patient is amenable.       Recommendations for follow up therapy are one component of a multi-disciplinary discharge planning process, led by the attending physician.  Recommendations may be updated based on patient status, additional functional criteria and insurance authorization.   Follow Up Recommendations  Skilled nursing-short term rehab (<3 hours/day)    Assistance Recommended at Discharge Frequent or constant Supervision/Assistance  Patient can return home with the following A little help with walking and/or transfers;A little help with bathing/dressing/bathroom;Assistance with cooking/housework;Direct supervision/assist for financial management;Assist for transportation;Help with stairs or ramp for entrance;Direct supervision/assist for medications management    Functional Status Assessment  Patient has had a recent decline in their functional status and demonstrates the ability to make  significant improvements in function in a reasonable and predictable amount of time.  Equipment Recommendations  None recommended by OT    Recommendations for Other Services       Precautions / Restrictions Precautions Precautions: Fall Restrictions Weight Bearing Restrictions: No      Mobility Bed Mobility               General bed mobility comments: at edge of bed with PT when therapist entered the room    Transfers Overall transfer level: Needs assistance Equipment used: Rolling walker (2 wheels) Transfers: Sit to/from Stand, Bed to chair/wheelchair/BSC Sit to Stand: Mod assist, Min assist, +2 safety/equipment     Step pivot transfers: Min assist, +2 safety/equipment     General transfer comment: mod-min A +2 for safety to power to stand, elevated bed slightly, pt prefers to pull from braced RW to power up, cues for bil quad/glute activation to rise, hip extension last, min A+2 for safety for step pivot to [facility], uncontrolled descent into recliner with single UE assisting      Balance Overall balance assessment: Needs assistance Sitting-balance support: No upper extremity supported, Feet supported Sitting balance-Leahy Scale: Normal     Standing balance support: During functional activity, Bilateral upper extremity supported, Reliant on assistive device for balance Standing balance-Leahy Scale: Poor Standing balance comment: able to release hands to wash in sink and lean on counter, dynamic with RW                           ADL either performed or assessed with clinical judgement   ADL Overall ADL's : Needs assistance/impaired Eating/Feeding: Independent   Grooming: Min guard;Standing;Wash/dry hands;Wash/dry face Grooming Details (indicate cue type and reason): with elbows propped on sink and min guard Upper Body Bathing:  Set up;Sitting   Lower Body Bathing: Minimal assistance;Sit to/from stand   Upper Body Dressing : Set up;Sitting   Lower  Body Dressing: Minimal assistance;Sit to/from stand   Toilet Transfer: Minimal assistance;Rolling walker (2 wheels);BSC/3in1   Toileting- Clothing Manipulation and Hygiene: Minimal assistance;Sit to/from stand Toileting - Clothing Manipulation Details (indicate cue type and reason): for clothing management and maintaining balance when hand off walker     Functional mobility during ADLs: Minimal assistance;+2 for safety/equipment;Rolling walker (2 wheels)       Vision   Vision Assessment?: No apparent visual deficits     Perception     Praxis      Pertinent Vitals/Pain Pain Assessment Pain Assessment: 0-10 Pain Score: 5  Pain Location: bil knee with movement Pain Descriptors / Indicators: Discomfort Pain Intervention(s): Monitored during session     Hand Dominance     Extremity/Trunk Assessment Upper Extremity Assessment Upper Extremity Assessment: Overall WFL for tasks assessed (WFL ROM, 5/5 strength, no apparent impairments)   Lower Extremity Assessment Lower Extremity Assessment: Defer to PT evaluation RLE Deficits / Details: ankle AROM WFL, knee AROM flexion >100 deg, hip strength grossly 3+/5 RLE Sensation: WNL RLE Coordination: WNL LLE Deficits / Details: ankle AROM WFL, knee AROM flexion ~100 deg,  hip strength grossly 3+/5 LLE Sensation: WNL LLE Coordination: WNL   Cervical / Trunk Assessment Cervical / Trunk Assessment: Normal   Communication Communication Communication: No difficulties   Cognition Arousal/Alertness: Awake/alert Behavior During Therapy: Flat affect Overall Cognitive Status: No family/caregiver present to determine baseline cognitive functioning                                 General Comments: Pt reports being tired, able to awaken and have conversation but maintains eyes closed ~75% of the convo then opens then when mobiliznig but remains flat, a&o x4     General Comments       Exercises     Shoulder Instructions       Home Living Family/patient expects to be discharged to:: Private residence Living Arrangements: Alone Available Help at Discharge: Friend(s);Available PRN/intermittently Type of Home: House Home Access: Level entry     Home Layout: One level     Bathroom Shower/Tub: Tub/shower unit         Home Equipment: None          Prior Functioning/Environment Prior Level of Function : Independent/Modified Independent;Driving             Mobility Comments: pt reports ind with community ambulation until ~3 days ago then holding onto furniture and walls until unable to walk ADLs Comments: pt reports ind        OT Problem List: Decreased activity tolerance;Impaired balance (sitting and/or standing);Decreased cognition;Decreased safety awareness;Decreased knowledge of use of DME or AE;Pain      OT Treatment/Interventions: Self-care/ADL training;DME and/or AE instruction;Energy conservation;Balance training;Therapeutic activities;Cognitive remediation/compensation;Patient/family education    OT Goals(Current goals can be found in the care plan section) Acute Rehab OT Goals Patient Stated Goal: to be independent OT Goal Formulation: With patient Time For Goal Achievement: 03/24/22 Potential to Achieve Goals: Good  OT Frequency: Min 2X/week    Co-evaluation PT/OT/SLP Co-Evaluation/Treatment: Yes Reason for Co-Treatment: For patient/therapist safety;To address functional/ADL transfers PT goals addressed during session: Mobility/safety with mobility OT goals addressed during session: ADL's and self-care      AM-PAC OT "6 Clicks" Daily Activity  Outcome Measure Help from another person eating meals?: None Help from another person taking care of personal grooming?: A Little Help from another person toileting, which includes using toliet, bedpan, or urinal?: A Little Help from another person bathing (including washing, rinsing, drying)?: A Little Help from another person  to put on and taking off regular upper body clothing?: A Little Help from another person to put on and taking off regular lower body clothing?: A Little 6 Click Score: 19   End of Session Equipment Utilized During Treatment: Rolling walker (2 wheels);Gait belt Nurse Communication: Mobility status  Activity Tolerance: Patient tolerated treatment well Patient left: in chair;with call bell/phone within reach;with chair alarm set  OT Visit Diagnosis: Pain;Other abnormalities of gait and mobility (R26.89)                Time: 1572-6203 OT Time Calculation (min): 14 min Charges:  OT General Charges $OT Visit: 1 Visit OT Evaluation $OT Eval Low Complexity: 1 Low  Gustavo Lah, OTR/L [place]  Office [phone]   Lenward Chancellor 03/10/2022, 4:10 PM

## 2022-03-10 NOTE — TOC Progression Note (Signed)
Transition of Care [facility]) - Progression Note    Patient Details  Name: SHAENA PARKERSON MRN: [id number] Date of Birth: [birth year]  Transition of Care [facility]) CM/SW Contact  Servando Snare, [place] Phone Number: 03/10/2022, 8:41 AM  Clinical Narrative:    Transition of Care [facility]) Screening Note   Patient Details  Name: PIER LAUX Date of Birth: 1956-02-12   Transition of Care [facility]) CM/SW Contact:    Servando Snare, LCSW Phone Number: 03/10/2022, 8:41 AM    Transition of Care Department [facility]) has reviewed patient and no TOC needs have been identified at this time. We will continue to monitor patient advancement through interdisciplinary progression rounds. If new patient transition needs arise, please place a TOC consult.         Expected Discharge Plan and Services                                                 Social Determinants of Health (SDOH) Interventions    Readmission Risk Interventions     No data to display

## 2022-03-11 DIAGNOSIS — Z87828 Personal history of other (healed) physical injury and trauma: Secondary | ICD-10-CM | POA: Diagnosis not present

## 2022-03-11 DIAGNOSIS — K5900 Constipation, unspecified: Secondary | ICD-10-CM | POA: Diagnosis present

## 2022-03-11 DIAGNOSIS — M25462 Effusion, left knee: Secondary | ICD-10-CM | POA: Diagnosis present

## 2022-03-11 DIAGNOSIS — M064 Inflammatory polyarthropathy: Secondary | ICD-10-CM | POA: Diagnosis present

## 2022-03-11 DIAGNOSIS — R262 Difficulty in walking, not elsewhere classified: Secondary | ICD-10-CM | POA: Diagnosis not present

## 2022-03-11 DIAGNOSIS — Z885 Allergy status to narcotic agent status: Secondary | ICD-10-CM | POA: Diagnosis not present

## 2022-03-11 DIAGNOSIS — Z79899 Other long term (current) drug therapy: Secondary | ICD-10-CM | POA: Diagnosis not present

## 2022-03-11 DIAGNOSIS — F419 Anxiety disorder, unspecified: Secondary | ICD-10-CM | POA: Diagnosis present

## 2022-03-11 DIAGNOSIS — Z888 Allergy status to other drugs, medicaments and biological substances status: Secondary | ICD-10-CM | POA: Diagnosis not present

## 2022-03-11 DIAGNOSIS — M170 Bilateral primary osteoarthritis of knee: Secondary | ICD-10-CM | POA: Diagnosis present

## 2022-03-11 DIAGNOSIS — Z87891 Personal history of nicotine dependence: Secondary | ICD-10-CM | POA: Diagnosis not present

## 2022-03-11 DIAGNOSIS — E876 Hypokalemia: Secondary | ICD-10-CM | POA: Diagnosis present

## 2022-03-11 DIAGNOSIS — G9332 Myalgic encephalomyelitis/chronic fatigue syndrome: Secondary | ICD-10-CM | POA: Diagnosis present

## 2022-03-11 DIAGNOSIS — Z8759 Personal history of other complications of pregnancy, childbirth and the puerperium: Secondary | ICD-10-CM | POA: Diagnosis not present

## 2022-03-11 DIAGNOSIS — I10 Essential (primary) hypertension: Secondary | ICD-10-CM | POA: Diagnosis present

## 2022-03-11 LAB — BASIC METABOLIC PANEL
Anion gap: 5 (ref 5–15)
BUN: 10 mg/dL (ref 8–23)
CO2: 27 mmol/L (ref 22–32)
Calcium: 8.1 mg/dL — ABNORMAL LOW (ref 8.9–10.3)
Chloride: 104 mmol/L (ref 98–111)
Creatinine, Ser: 0.56 mg/dL (ref 0.44–1.00)
GFR, Estimated: >60 mL/min (ref >60)
Glucose, Bld: 97 mg/dL (ref 70–99)
Potassium: 3.2 mmol/L — ABNORMAL LOW (ref 3.5–5.1)
Sodium: 136 mmol/L (ref 135–145)

## 2022-03-11 LAB — CBC
HCT: 30.5 % — ABNORMAL LOW (ref 36.0–46.0)
Hemoglobin: 10.4 g/dL — ABNORMAL LOW (ref 12.0–15.0)
MCH: 28.3 pg (ref 26.0–34.0)
MCHC: 34.1 g/dL (ref 30.0–36.0)
MCV: 82.9 fL (ref 80.0–100.0)
Platelets: 230 10*3/uL (ref 150–400)
RBC: 3.68 MIL/uL — ABNORMAL LOW (ref 3.87–5.11)
RDW: 14.2 % (ref 11.5–15.5)
WBC: 7 10*3/uL (ref 4.0–10.5)
nRBC: 0 % (ref 0.0–0.2)

## 2022-03-11 LAB — MAGNESIUM: Magnesium: 2.1 mg/dL (ref 1.7–2.4)

## 2022-03-11 LAB — PHOSPHORUS: Phosphorus: 3.8 mg/dL (ref 2.5–4.6)

## 2022-03-11 MED ORDER — ALPRAZOLAM 0.25 MG PO TABS
2.0000 mg | ORAL_TABLET | Freq: Two times a day (BID) | ORAL | Status: DC | PRN
  Administered 2022-03-12: 2 mg via ORAL
  Filled 2022-03-11: qty 8

## 2022-03-11 MED ORDER — ALPRAZOLAM 0.5 MG PO TABS: 2.0000 mg | ORAL_TABLET | Freq: Two times a day (BID) | ORAL | Status: DC | PRN

## 2022-03-11 MED ORDER — POLYETHYLENE GLYCOL 3350 17 G PO PACK
17.0000 g | PACK | Freq: Every day | ORAL | Status: DC
  Administered 2022-03-11: 17 g via ORAL
  Filled 2022-03-11: qty 1

## 2022-03-11 MED ORDER — POTASSIUM CHLORIDE CRYS ER 20 MEQ PO TBCR
40.0000 meq | EXTENDED_RELEASE_TABLET | Freq: Once | ORAL | Status: AC
  Administered 2022-03-11: 40 meq via ORAL
  Filled 2022-03-11: qty 2

## 2022-03-11 NOTE — Progress Notes (Signed)
Orthopedic Tech Progress Note Patient Details:  Anna Jenkins 04-Dec-1955 [id number]  Ortho Devices Type of Ortho Device: Crutches, Knee Sleeve   Post Interventions Patient Tolerated: Well Instructions Provided: Care of device, Adjustment of device  [place] Pink 03/11/2022, 9:48 AM

## 2022-03-11 NOTE — Progress Notes (Signed)
PROGRESS NOTE    Anna Jenkins  BOF:[id number] DOB: [birth year] DOA: 03/08/2022 PCP: Fleet Contras, MD    Brief Narrative:  Anna Jenkins is a 66 y.o. 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  She did have arthrocentesis with some improvement and was discharged home to follow-up with orthopedic surgery but was severely hypokalemic so medical team was consulted for admission to the hospital.  Vitals were stable.  WBC was 10.7.  Potassium was 2.2.  Sed rate was 53.  synovial fluid analysis was consistent with inflammatory but not septic arthritis.  X-ray showed large suprapatellar effusion.  Patient stated that she was allergic to oral potassium KCl so parenteral KCl and potassium citrate's was initiated.  During hospitalization, patient was seen by physical therapy who recommended skilled nursing facility placement.  Assessment and plan  Principal Problem:   Hypokalemia Active Problems:   Hypophosphatemia   Hypocalcemia   Hypertension   Effusion of left knee   Ambulatory dysfunction   Hypokalemia Patient received IV potassium and potassium citrate.  Patient had initial improvement of potassium.  Has decreased to 3.2 today.  We will replenish.  Check levels in a.m.    Hypophosphatemia Phosphorus level was 2.2.  Received K-Phos.  Phosphorus level of 3.8 during this time     Hypertension Continue amlodipine,Azor from home.  Blood pressure seems to be stable.   Bilateral effusion of  knees, ambulatory dysfunction, generalized weakness Improved after arthrocentesis.  Follow-up with orthopedic surgery as outpatient.  No evidence of infection from fluid analysis.  Patient was given options of NSAIDs but cannot tolerated.  Was given option about steroids which she is skeptical about it.  Physical therapy has seen the patient and recommended skilled nursing facility at  this time.    DVT prophylaxis: enoxaparin (LOVENOX) injection 40 mg Start: 03/09/22 2200   Code Status:     Code Status: Full Code  Disposition: Skilled nursing facility as per PT evaluation.  Patient lives by herself at home.  Status is: Inpatient  The patient is inpatient because: Generalized weakness, bilateral knee pain, ambulatory dysfunction, unsafe disposition,   Family Communication: None at bedside  Consultants:  None  Procedures:  knee joint aspiration  Antimicrobials:  None  Anti-infectives (From admission, onward)    None      Subjective:  Today, patient was seen and examined at bedside.  Complains of bilateral knee pain effusion and swelling.  Patient states that she has allergies to dyes and oral medications.  Objective: Vitals:   03/10/22 1416 03/10/22 1954 03/11/22 0406 03/11/22 0930  BP: 135/87 132/75 (!) 143/80 137/86  Pulse: 80 78 74 76  Resp: 17 16 18 20   Temp: 98.7 F (37.1 C) 98 F (36.7 C) 97.8 F (36.6 C) 98.7 F (37.1 C)  TempSrc: Oral Oral Oral Oral  SpO2: 94% 98% 96%   Weight:      Height:        Intake/Output Summary (Last 24 hours) at 03/11/2022 1226 Last data filed at 03/10/2022 2100 Gross per 24 hour  Intake --  Output 1100 ml  Net -1100 ml    Filed Weights   03/08/22 1751  Weight: 74 kg    Physical Examination: Body mass index is 26.33 kg/m.   General:  Average built, not in obvious distress HENT:   No scleral pallor or icterus noted. Oral mucosa is moist.  Chest:  Clear breath sounds.  Diminished breath sounds bilaterally. No crackles or wheezes.  CVS: S1 &S2 heard. No murmur.  Regular rate and rhythm. Abdomen: Soft, nontender, nondistended.  Bowel sounds are heard.   Extremities: No cyanosis, clubbing. Peripheral pulses are palpable.  Bilateral knee joint effusion with joint line tenderness. Psych: Alert, awake and oriented, normal mood CNS:  No cranial nerve deficits.  Power equal in all extremities.    Skin: Warm and dry.  No rashes noted.  Data Reviewed:   CBC: Recent Labs  Lab 03/08/22 2222 03/10/22 0557 03/11/22 0613  WBC 10.7* 9.2 7.0  NEUTROABS 8.0*  --   --   HGB 12.9 11.4* 10.4*  HCT 38.4 34.5* 30.5*  MCV 82.6 85.2 82.9  PLT 245 241 230     Basic Metabolic Panel: Recent Labs  Lab 03/08/22 2222 03/09/22 0937 03/09/22 1923 03/10/22 0557 03/11/22 0613  NA 137 137 135 135 136  K 2.2* 2.2* 3.4* 4.6 3.2*  CL 95* 104 99 105 104  CO2 33* 27 27 26 27   GLUCOSE 115* 102* 117* 121* 97  BUN 7* 8 10 13 10   CREATININE 0.75 0.60 0.66 0.71 0.56  CALCIUM 8.7* 7.3* 8.3* 8.0* 8.1*  MG  --  1.8  --   --  2.1  PHOS  --  2.2*  --   --  3.8     Liver Function Tests: Recent Labs  Lab 03/09/22 0937 03/10/22 0557  AST 16 20  ALT 11 13  ALKPHOS 48 47  BILITOT 1.0 0.6  PROT 6.5 6.4*  ALBUMIN 2.9* 2.6*      Radiology Studies: No results found.    LOS: 0 days    Flora Lipps, MD [facility] Available via Epic secure chat 7am-7pm After these hours, please refer to coverage provider listed on [email or web address] 03/11/2022, 12:26 PM

## 2022-03-11 NOTE — Plan of Care (Signed)

## 2022-03-11 NOTE — Progress Notes (Signed)
I attest to student documentation.  Juel Ripley, MSN-RN, CFPN Nursing Adjunct Faculty [employer] 

## 2022-03-12 DIAGNOSIS — M25462 Effusion, left knee: Secondary | ICD-10-CM | POA: Diagnosis not present

## 2022-03-12 DIAGNOSIS — I10 Essential (primary) hypertension: Secondary | ICD-10-CM | POA: Diagnosis not present

## 2022-03-12 DIAGNOSIS — E876 Hypokalemia: Secondary | ICD-10-CM | POA: Diagnosis not present

## 2022-03-12 DIAGNOSIS — R262 Difficulty in walking, not elsewhere classified: Secondary | ICD-10-CM | POA: Diagnosis not present

## 2022-03-12 LAB — CBC
HCT: 31.8 % — ABNORMAL LOW (ref 36.0–46.0)
Hemoglobin: 10.7 g/dL — ABNORMAL LOW (ref 12.0–15.0)
MCH: 27.9 pg (ref 26.0–34.0)
MCHC: 33.6 g/dL (ref 30.0–36.0)
MCV: 83 fL (ref 80.0–100.0)
Platelets: 253 10*3/uL (ref 150–400)
RBC: 3.83 MIL/uL — ABNORMAL LOW (ref 3.87–5.11)
RDW: 13.9 % (ref 11.5–15.5)
WBC: 6.3 10*3/uL (ref 4.0–10.5)
nRBC: 0 % (ref 0.0–0.2)

## 2022-03-12 LAB — URINALYSIS, ROUTINE W REFLEX MICROSCOPIC
Bilirubin Urine: NEGATIVE
Glucose, UA: NEGATIVE mg/dL
Ketones, ur: NEGATIVE mg/dL
Nitrite: NEGATIVE
Protein, ur: NEGATIVE mg/dL
Specific Gravity, Urine: 1.01 (ref 1.005–1.030)
pH: 7 (ref 5.0–8.0)

## 2022-03-12 LAB — BASIC METABOLIC PANEL
Anion gap: 5 (ref 5–15)
BUN: 14 mg/dL (ref 8–23)
CO2: 28 mmol/L (ref 22–32)
Calcium: 8.2 mg/dL — ABNORMAL LOW (ref 8.9–10.3)
Chloride: 103 mmol/L (ref 98–111)
Creatinine, Ser: 0.74 mg/dL (ref 0.44–1.00)
GFR, Estimated: >60 mL/min (ref >60)
Glucose, Bld: 111 mg/dL — ABNORMAL HIGH (ref 70–99)
Potassium: 3.3 mmol/L — ABNORMAL LOW (ref 3.5–5.1)
Sodium: 136 mmol/L (ref 135–145)

## 2022-03-12 LAB — URINALYSIS, MICROSCOPIC (REFLEX)

## 2022-03-12 MED ORDER — POTASSIUM CHLORIDE CRYS ER 20 MEQ PO TBCR
40.0000 meq | EXTENDED_RELEASE_TABLET | Freq: Once | ORAL | Status: AC
  Administered 2022-03-12: 40 meq via ORAL
  Filled 2022-03-12: qty 2

## 2022-03-12 MED ORDER — DICLOFENAC EPOLAMINE 1.3 % EX PTCH
1.0000 | MEDICATED_PATCH | Freq: Two times a day (BID) | CUTANEOUS | Status: DC
  Administered 2022-03-12 – 2022-03-13 (×3): 1 via TRANSDERMAL
  Filled 2022-03-12 (×3): qty 1

## 2022-03-12 NOTE — TOC Progression Note (Signed)
Transition of Care [facility]) - Progression Note    Patient Details  Name: Anna Jenkins MRN: [id number] Date of Birth: July 15, 1955  Transition of Care [facility]) CM/SW Contact  Ross Ludwig,  Phone Number: 03/12/2022, 7:34 PM  Clinical Narrative:     CSW was informed that patient wanted to speak to [name] regarding snf placement verse HH, verse outpatient therapy.  CSW explained to patient even though PT recommended SNF, it may be challenge to find a SNF that is able to accept her insurance.  CSW explained what to expect at SNF, and patient was hesitant about going.  She would like CSW to begin bed search and she will think about it if she gets some offers.  CSW explained to her that if she goes home with home health, they will not be there every day, only a couple days a week.  Patient stated she is considering HH instead of SNF.  Patient is also trying to decide between a rolling walker and a wheelchair, patient would like CSW to follow up with her on Monday and also print out options for her.  CSW to follow up on Monday.        Expected Discharge Plan and Services                                                 Social Determinants of Health (SDOH) Interventions    Readmission Risk Interventions     No data to display

## 2022-03-12 NOTE — Plan of Care (Signed)

## 2022-03-12 NOTE — Progress Notes (Signed)
PROGRESS NOTE    Anna Jenkins  IRC:[id number] DOB: 06-Mar-1956 DOA: 03/08/2022 PCP: Fleet Contras, MD    Brief Narrative:  Anna Jenkins is a 66 y.o. female with past medical history of anxiety, chronic fatigue syndrome, hypertension,  trophoblastic disease presented to hospital with knee pain and edema.  She did have left arthrocentesis with some improvement and was supposed to be discharged home from ED to follow-up with orthopedic surgery but was severely hypokalemic .so medical team was consulted for admission to the hospital.  Vitals were stable.  WBC was 10.7.  Potassium was 2.2.  Sed rate was 53.  Synovial fluid analysis was consistent with inflammatory but not septic arthritis.  X-ray showed large suprapatellar effusion.  Patient stated that she was allergic to oral potassium KCl so parenteral KCl and potassium citrate's was given.  During hospitalization, patient was seen by physical therapy who recommended skilled nursing facility placement.  Patient continues to have ambulatory dysfunction and complains of bilateral knee pain.  She is reluctant on trying steroids or oral NSAIDs due to her stomach issues.    Assessment and plan  Principal Problem:   Hypokalemia Active Problems:   Hypophosphatemia   Hypocalcemia   Hypertension   Effusion of left knee   Ambulatory dysfunction   Hypokalemia Patient received IV potassium and potassium citrate.  Was able to tolerate potassium chloride okay.  We will continue to replace today for potassium of 3.3.      Hypophosphatemia Phosphorus level was 2.2.  Received K-Phos.  Latest phosphorus level was 3.8.    Hypertension Continue amlodipine at home.  Patient does have Azor from home is nonformulary.   I have advised her to bring her own supply so that she can get for home medication.  Patient is getting substitution for AZOR while in the hospital.    Bilateral effusion of  knees, ambulatory dysfunction, generalized weakness Status  post left arthrocentesis with removal of synovial fluid.  No evidence of infection.   Patient was given options of NSAIDs but cannot tolerate all additional is on IV Toradol.  Have prescribed diclofenac patches bilaterally to the knees.  She is reluctant on trying steroids.   Physical therapy has seen the patient and recommended skilled nursing facility at this time.    DVT prophylaxis: enoxaparin (LOVENOX) injection 40 mg Start: 03/09/22 2200   Code Status:     Code Status: Full Code  Disposition: Skilled nursing facility as per PT evaluation.  Patient lives by herself at home.  Status is: Inpatient  The patient is inpatient because: Generalized weakness, bilateral knee pain/effusion, ambulatory dysfunction, unsafe disposition-lives by herself.,   Family Communication: None at bedside  Consultants:  None  Procedures:  Left knee joint aspiration  Antimicrobials:  None  Anti-infectives (From admission, onward)    None      Subjective:  Today, patient was seen and examined at bedside.  Still complains of intermittent sharp knee joint pain.  No fever chills nausea vomiting.  Objective: Vitals:   03/11/22 1239 03/11/22 2122 03/12/22 0523 03/12/22 1310  BP: (!) 141/88 130/79 123/70 138/87  Pulse: 78 83 64 64  Resp: 18 18 16 16   Temp: 98.2 F (36.8 C) 98.2 F (36.8 C) 97.7 F (36.5 C) 98.1 F (36.7 C)  TempSrc: Oral Oral Oral Oral  SpO2: 99% 97% 97% 100%  Weight:      Height:       No intake or output data in the 24 hours ending  03/12/22 1440  Filed Weights   03/08/22 1751  Weight: 74 kg    Physical Examination: Body mass index is 26.33 kg/m.   General:  Average built, not in obvious distress HENT:   No scleral pallor or icterus noted. Oral mucosa is moist.  Chest:  Clear breath sounds.  Diminished breath sounds bilaterally. No crackles or wheezes.  CVS: S1 &S2 heard. No murmur.  Regular rate and rhythm. Abdomen: Soft, nontender, nondistended.  Bowel sounds  are heard.   Extremities: No cyanosis, clubbing or edema.  Bilateral knee joint effusion with joint line tenderness.  No erythema peripheral pulses are palpable. Psych: Alert, awake and oriented, normal mood CNS:  No cranial nerve deficits.  Power equal in all extremities.   Skin: Warm and dry.  No rashes noted.  Data Reviewed:   CBC: Recent Labs  Lab 03/08/22 2222 03/10/22 0557 03/11/22 0613 03/12/22 0602  WBC 10.7* 9.2 7.0 6.3  NEUTROABS 8.0*  --   --   --   HGB 12.9 11.4* 10.4* 10.7*  HCT 38.4 34.5* 30.5* 31.8*  MCV 82.6 85.2 82.9 83.0  PLT 245 241 230 253     Basic Metabolic Panel: Recent Labs  Lab 03/09/22 0937 03/09/22 1923 03/10/22 0557 03/11/22 0613 03/12/22 0602  NA 137 135 135 136 136  K 2.2* 3.4* 4.6 3.2* 3.3*  CL 104 99 105 104 103  CO2 27 27 26 27 28   GLUCOSE 102* 117* 121* 97 111*  BUN 8 10 13 10 14   CREATININE 0.60 0.66 0.71 0.56 0.74  CALCIUM 7.3* 8.3* 8.0* 8.1* 8.2*  MG 1.8  --   --  2.1  --   PHOS 2.2*  --   --  3.8  --      Liver Function Tests: Recent Labs  Lab 03/09/22 0937 03/10/22 0557  AST 16 20  ALT 11 13  ALKPHOS 48 47  BILITOT 1.0 0.6  PROT 6.5 6.4*  ALBUMIN 2.9* 2.6*      Radiology Studies: No results found.    LOS: 1 day    Flora Lipps, MD [facility] Available via Epic secure chat 7am-7pm After these hours, please refer to coverage provider listed on [email or web address] 03/12/2022, 2:40 PM

## 2022-03-13 DIAGNOSIS — E876 Hypokalemia: Secondary | ICD-10-CM | POA: Diagnosis not present

## 2022-03-13 LAB — BASIC METABOLIC PANEL
Anion gap: 8 (ref 5–15)
BUN: 14 mg/dL (ref 8–23)
CO2: 25 mmol/L (ref 22–32)
Calcium: 8.2 mg/dL — ABNORMAL LOW (ref 8.9–10.3)
Chloride: 102 mmol/L (ref 98–111)
Creatinine, Ser: 0.71 mg/dL (ref 0.44–1.00)
GFR, Estimated: >60 mL/min (ref >60)
Glucose, Bld: 100 mg/dL — ABNORMAL HIGH (ref 70–99)
Potassium: 3.6 mmol/L (ref 3.5–5.1)
Sodium: 135 mmol/L (ref 135–145)

## 2022-03-13 LAB — CBC
HCT: 31.9 % — ABNORMAL LOW (ref 36.0–46.0)
Hemoglobin: 10.6 g/dL — ABNORMAL LOW (ref 12.0–15.0)
MCH: 28 pg (ref 26.0–34.0)
MCHC: 33.2 g/dL (ref 30.0–36.0)
MCV: 84.4 fL (ref 80.0–100.0)
Platelets: 270 10*3/uL (ref 150–400)
RBC: 3.78 MIL/uL — ABNORMAL LOW (ref 3.87–5.11)
RDW: 13.8 % (ref 11.5–15.5)
WBC: 5.9 10*3/uL (ref 4.0–10.5)
nRBC: 0 % (ref 0.0–0.2)

## 2022-03-13 LAB — MAGNESIUM: Magnesium: 1.9 mg/dL (ref 1.7–2.4)

## 2022-03-13 MED ORDER — OXYCODONE HCL 5 MG PO TABS: 5.0000 mg | ORAL_TABLET | Freq: Four times a day (QID) | ORAL | 0 refills | Status: AC | PRN

## 2022-03-13 MED ORDER — ACETAMINOPHEN 325 MG PO TABS: 650.0000 mg | ORAL_TABLET | Freq: Four times a day (QID) | ORAL | Status: AC | PRN

## 2022-03-13 MED ORDER — POTASSIUM CHLORIDE CRYS ER 20 MEQ PO TBCR: 20.0000 meq | EXTENDED_RELEASE_TABLET | Freq: Every day | ORAL | 0 refills | Status: AC

## 2022-03-13 NOTE — Discharge Summary (Signed)
Physician Discharge Summary  Anna Jenkins NOI:[id number] DOB: 1956-05-06 DOA: 03/08/2022  PCP: Nolene Ebbs, MD  Admit date: 03/08/2022 Discharge date: 03/13/2022  Admitted From: Home Disposition: Home with home health  Recommendations for Outpatient Follow-up:  Follow up with PCP in 1-2 weeks Schedule follow-up with orthopedics  Home Health: PT/home health aide/social worker Equipment/Devices: Bedside commode, wheelchair  Discharge Condition: Stable   CODE STATUS:Full Code  Diet recommendation: Low-salt diet  Discharge summary: Patient is 66 year old female with history of anxiety, chronic fatigue syndrome, hypertension, bilateral knee osteoarthritis presented to the ER with knee pain and edema left more than right.  She had arthrocentesis done in the emergency room, however found to have potassium of 2.2 and was hospitalized.  Continues to have pain, difficulty mobility and correction of potassium so she stayed in the hospital.  Currently clinically stabilized.  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  Pain managed with local pain medications, occasional oxycodone.  She will use local therapies and continue to physical therapy.  She has well-established follow-up with orthopedics.  Due to significant pain and swelling, she will benefit with additional equipments at home including wheelchair and bedside commode.  She will do home health physical therapy.  Hypokalemia: Chronic and persistent.  She has diagnosed hypokalemia.  Patient declined to take potassium pills as outpatient for supplementation because of her allergies.  Patient was given IV potassium chloride.  She was given oral potassium chloride tablets for the last 3 subsequent days with no allergies.  Prescribed 20 mEq potassium chloride every day to avoid hypokalemia.  Other electrolytes are normal.  Essential hypertension: Blood pressure stable.  She is on Azor at home.   She can resume it.  Please note that patient has reservations about many medications.  She is very careful about additives/dye on the medications used and tries to avoid them.  She is prescribed same potassium chloride tablets that she has used in the hospital with no allergies. Patient was given a short course of oxycodone.  Stable for discharge.  She will benefit with home health PT.    Discharge Diagnoses:  Principal Problem:   Hypokalemia Active Problems:   Hypophosphatemia   Hypocalcemia   Hypertension   Effusion of left knee   Ambulatory dysfunction    Discharge Instructions  Discharge Instructions     Call MD for:  severe uncontrolled pain   Complete by: As directed    Diet - low sodium heart healthy   Complete by: As directed    Increase activity slowly   Complete by: As directed    Walker standard   Complete by: As directed       Allergies as of 03/13/2022       Reactions   Codeine    Flu-like symptoms        Medication List     STOP taking these medications    buPROPion 75 MG tablet Commonly known as: WELLBUTRIN       TAKE these medications    acetaminophen 325 MG tablet Commonly known as: TYLENOL Take 2 tablets (650 mg total) by mouth every 6 (six) hours as needed for mild pain (or Fever >/= 101).   AZOR PO Take 20 mg by mouth daily.   diclofenac sodium 1 % Gel Commonly known as: Voltaren Apply 2 g topically 4 (four) times daily.   oxyCODONE 5 MG immediate release tablet Commonly known as: Oxy IR/ROXICODONE Take 1 tablet (5 mg total) by mouth  every 6 (six) hours as needed for up to 5 days for moderate pain.   potassium chloride SA 20 MEQ tablet Commonly known as: KLOR-CON M Take 1 tablet (20 mEq total) by mouth daily.   Xanax 2 MG tablet Generic drug: alprazolam Take 2 mg by mouth 2 (two) times daily as needed for sleep or anxiety.        Follow-up Information     Fleet Contras, MD. Call .   Specialty: Internal  Medicine Why: please check potassium levels in 2 weeks Contact information: [address] [place] [address] [phone]         Orthopedic surgery. Call .   Why: to schedule an appointment for close follow up               Allergies  Allergen Reactions   Codeine     Flu-like symptoms    Consultations: None   Procedures/Studies: DG Knee Complete 4 Views Left  Result Date: 03/08/2022 CLINICAL DATA:  Swelling of the knee for 4 days. 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 No definitive visible fracture or sign of dislocation but there is a large joint effusion which up lifts the patella. IMPRESSION: 1. Large suprapatellar effusion should be correlated with any history of trauma or infection. No visible fracture. 2. Degenerative changes about the knee. Electronically Signed   By: Donzetta Kohut M.D.   On: 03/08/2022 18:21   (Echo, Carotid, EGD, Colonoscopy, ERCP)    Subjective: Patient seen and examined.  Mild to moderate pain persist both knees.  She was asking me how to get it up osteoarthritis and swelling of the knees.  Discussed with her about treatment of osteoarthritis, continuing physical therapy and possibly she will need knee replacement in the future with her orthopedics.  Potassium has improved and her strength has improved.   Discharge Exam: Vitals:   03/13/22 0855 03/13/22 0900  BP: (!) 152/90   Pulse: (!) 59   Resp: 16 13  Temp: 98 F (36.7 C)   SpO2: 99%    Vitals:   03/13/22 0700 03/13/22 0800 03/13/22 0855 03/13/22 0900  BP:   (!) 152/90   Pulse:   (!) 59   Resp: 11 16 16 13   Temp:   98 F (36.7 C)   TempSrc:   Oral   SpO2:   99%   Weight:      Height:        General: Pt is alert, awake, not in acute distress.  On room air. Cardiovascular: RRR, S1/S2 +, no rubs, no gallops Respiratory: CTA bilaterally,  no wheezing, no rhonchi Abdominal: Soft, NT, ND, bowel sounds + Extremities: no edema, no cyanosis Patient has mild swelling of the left knee without any tenderness or erythema.  Patella tap negative.    The results of significant diagnostics from this hospitalization (including imaging, microbiology, ancillary and laboratory) are listed below for reference.     Microbiology: Recent Results (from the past 240 hour(s))  Aerobic/Anaerobic Culture w Gram Stain (surgical/deep wound)     Status: None (Preliminary result)   Collection Time: 03/09/22 12:52 AM   Specimen: Synovium  Result Value Ref Range Status   Specimen Description SYNOVIAL FLUID  Final   Special Requests LEFT KNEE  Final   Gram Stain   Final    RARE WBC PRESENT, PREDOMINANTLY PMN NO ORGANISMS SEEN  Culture   Final    NO GROWTH 4 DAYS NO ANAEROBES ISOLATED; CULTURE IN PROGRESS FOR 5 DAYS Performed at [facility], [address] [place], [place] [address]    Report Status PENDING  Incomplete     Labs: BNP (last 3 results) No results for input(s): "BNP" in the last 8760 hours. Basic Metabolic Panel: Recent Labs  Lab 03/09/22 0937 03/09/22 1923 03/10/22 0557 03/11/22 0613 03/12/22 0602 03/13/22 0505  NA 137 135 135 136 136 135  K 2.2* 3.4* 4.6 3.2* 3.3* 3.6  CL 104 99 105 104 103 102  CO2 27 27 26 27 28 25   GLUCOSE 102* 117* 121* 97 111* 100*  BUN 8 10 13 10 14 14   CREATININE 0.60 0.66 0.71 0.56 0.74 0.71  CALCIUM 7.3* 8.3* 8.0* 8.1* 8.2* 8.2*  MG 1.8  --   --  2.1  --  1.9  PHOS 2.2*  --   --  3.8  --   --    Liver Function Tests: Recent Labs  Lab 03/09/22 0937 03/10/22 0557  AST 16 20  ALT 11 13  ALKPHOS 48 47  BILITOT 1.0 0.6  PROT 6.5 6.4*  ALBUMIN 2.9* 2.6*   No results for input(s): "LIPASE", "AMYLASE" in the last 168 hours. No results for input(s): "AMMONIA" in the last 168 hours. CBC: Recent Labs  Lab 03/08/22 2222 03/10/22 0557 03/11/22 0613 03/12/22 0602  03/13/22 0505  WBC 10.7* 9.2 7.0 6.3 5.9  NEUTROABS 8.0*  --   --   --   --   HGB 12.9 11.4* 10.4* 10.7* 10.6*  HCT 38.4 34.5* 30.5* 31.8* 31.9*  MCV 82.6 85.2 82.9 83.0 84.4  PLT 245 241 230 253 270   Cardiac Enzymes: No results for input(s): "CKTOTAL", "CKMB", "CKMBINDEX", "TROPONINI" in the last 168 hours. BNP: Invalid input(s): "POCBNP" CBG: No results for input(s): "GLUCAP" in the last 168 hours. D-Dimer No results for input(s): "DDIMER" in the last 72 hours. Hgb A1c No results for input(s): "HGBA1C" in the last 72 hours. Lipid Profile No results for input(s): "CHOL", "HDL", "LDLCALC", "TRIG", "CHOLHDL", "LDLDIRECT" in the last 72 hours. Thyroid function studies No results for input(s): "TSH", "T4TOTAL", "T3FREE", "THYROIDAB" in the last 72 hours.  Invalid input(s): "FREET3" Anemia work up No results for input(s): "VITAMINB12", "FOLATE", "FERRITIN", "TIBC", "IRON", "RETICCTPCT" in the last 72 hours. Urinalysis    Component Value Date/Time   COLORURINE YELLOW 03/12/2022 1215   APPEARANCEUR CLEAR 03/12/2022 1215   LABSPEC 1.010 03/12/2022 1215   PHURINE 7.0 03/12/2022 1215   GLUCOSEU NEGATIVE 03/12/2022 1215   HGBUR TRACE (A) 03/12/2022 1215   BILIRUBINUR NEGATIVE 03/12/2022 1215   KETONESUR NEGATIVE 03/12/2022 1215   PROTEINUR NEGATIVE 03/12/2022 1215   NITRITE NEGATIVE 03/12/2022 1215   LEUKOCYTESUR SMALL (A) 03/12/2022 1215   Sepsis Labs Recent Labs  Lab 03/10/22 0557 03/11/22 0613 03/12/22 0602 03/13/22 0505  WBC 9.2 7.0 6.3 5.9   Microbiology Recent Results (from the past 240 hour(s))  Aerobic/Anaerobic Culture w Gram Stain (surgical/deep wound)     Status: None (Preliminary result)   Collection Time: 03/09/22 12:52 AM   Specimen: Synovium  Result Value Ref Range Status   Specimen Description SYNOVIAL FLUID  Final   Special Requests LEFT KNEE  Final   Gram Stain   Final    RARE WBC PRESENT, PREDOMINANTLY PMN NO ORGANISMS SEEN    Culture   Final     NO GROWTH 4 DAYS NO ANAEROBES  ISOLATED; CULTURE IN PROGRESS FOR 5 DAYS Performed at [facility], [address], [place], [place] [address]    Report Status PENDING  Incomplete     Time coordinating discharge: 35 minutes  SIGNED:   Dorcas Carrow, MD  [facility] 03/13/2022, 9:58 AM

## 2022-03-13 NOTE — TOC Transition Note (Signed)
Transition of Care [facility]) - CM/SW Discharge Note   Patient Details  Name: Anna Jenkins MRN: [id number] Date of Birth: [birth year]  Transition of Care [facility]) CM/SW Contact:  Ross Ludwig, LCSW Phone Number: 03/13/2022, 2:47 PM   Clinical Narrative:     10:15am CSW was informed that patient may want to appeal the discharge.  CSW spoke to patient to discuss if she is going to appeal the discharge.  Per patient she has decided not to appeal.  CSW asked if she made a decision for going to a SNF or going home with home health.    Patient was undecided and wanted to speak to her disability advocate through [place] [facility] [place].  CSW informed patient and disability advocate that CSW can not discuss information without a signed consent and release form.  Patient gave verbal consent, but CSW insisted that the actual consent be faxed to the hospital.  CSW provided the fax number, consent and release form received at 10:18 am.  After discussion with patient and disability advocate, patient decided she would like to go home with home health.  CSW asked if she had a preference for agencies and she did not.  CSW contacted Kerry Dory at [facility], and he agreed to accept patient for [facility] PT, aide, and Education officer, museum, [place] notified attending physician and orders were written.    Disability advocate requested that home health agency contact him so he can discuss patient's progress with them.  CSW informed him that the [facility] agency can not discuss unless they have the signed consent and release form.    12:15 pm  CSW was given verbal consent by patient to fax the consent to [facility] faxed it to [phone], confirmation of fax that was received at 12:22 pm.   Disability advocate Elta Guadeloupe requested a copy of patient's discharge summary to be faxed to them at [phone], [place] faxed requested information to them.  Disability advocate also asked if copies of the medical records can be  faxed to them as well, CSW informed him that he will have to talk to medical records once patient discharges to go through the process of getting copies of her medical records.  Patient's advocate expressed understanding.  Patient discussed with CSW the options for DME including wheelchair or walker, and a bedside commode.  Patient decided on a wheelchair and not a bedside commode.  CSW asked if she had a preference for DME agencies and she did not.  CSW spoke to Wilton Manors from [facility] and they will provide wheelchair for patient and deliver to room prior to discharge.  1:00 pm Patient asked to speak to CSW again, she informed this CSW that she does not have a ride back home, CSW offered her EMS transport however she would not be able to have the wheelchair delivered to the room, because EMS will not transport DME.    CSW asked if she had anyone who could pick up her or the wheelchair from the hospital, she said no.  CSW then asked patient if she is able to pay for a cab or share ride service, she said no.  CSW then asked her if she has Medicaid Transportation set up and she said no.  CSW then offered to use safe transport to get patient back home, and she agreed to using the service.  CSW contacted [facility] [employer] and they can pick patient up around 2:30 pm, CSW updated patient and bedside nurse.  2:15pm  Bedside nurse informed this CSW that patient wanted to speak to CSW again regarding her prescriptions, CSW went to talk with patient, and she said she is unable to get her medications today.  CSW suggested that she ask the safe transport driver if he or she can stop by patient's pharmacy while in transit to patient's home.  CSW encouraged patient to talk to the driver, she said okay she will ask them.    Patient will be going home with home health PT, Aide, and social worker through [place].  TOC signing off please reconsult with any other TOC needs, home health agency has been notified of planned  discharge.     Patient Goals and CMS Choice  [facility] home health agency.      Discharge Placement  Home with home health PT, aide, and social worker.                     Discharge Plan and Services                                     Social Determinants of Health (SDOH) Interventions     Readmission Risk Interventions     No data to display
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
## 2022-03-13 NOTE — Care Management (Signed)
    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  A walker will not resolve  issue with performing activities of daily living. A wheelchair will allow patient to safely perform daily activities. Patient is not able to propel themselves in the home using a standard weight wheelchair due to endurance and general weakness. Patient can self propel in the lightweight wheelchair. Length of need Lifetime. Accessories: elevating leg rests (ELRs), wheel locks, extensions and anti-tippers.   03/13/22 1124   Unscheduled  For home use only DME Bedside commode  Once       Question:  Patient needs a bedside commode to treat with the following condition  Answer:  Osteoarthritis   03/13/22 1124

## 2022-03-14 LAB — AEROBIC/ANAEROBIC CULTURE W GRAM STAIN (SURGICAL/DEEP WOUND): Culture: NO GROWTH

## 2022-08-29 ENCOUNTER — Other Ambulatory Visit: Payer: Self-pay

## 2022-08-29 ENCOUNTER — Encounter (HOSPITAL_BASED_OUTPATIENT_CLINIC_OR_DEPARTMENT_OTHER): Payer: Self-pay | Admitting: Emergency Medicine

## 2022-08-29 ENCOUNTER — Emergency Department (HOSPITAL_BASED_OUTPATIENT_CLINIC_OR_DEPARTMENT_OTHER)
Admission: EM | Admit: 2022-08-29 | Discharge: 2022-08-29 | Disposition: A | Discharge status: Home / Self Care | Payer: 59 | Attending: Emergency Medicine | Admitting: Emergency Medicine

## 2022-08-29 ENCOUNTER — Emergency Department (HOSPITAL_BASED_OUTPATIENT_CLINIC_OR_DEPARTMENT_OTHER): Payer: 59 | Admitting: Radiology

## 2022-08-29 DIAGNOSIS — Z79899 Other long term (current) drug therapy: Secondary | ICD-10-CM | POA: Diagnosis not present

## 2022-08-29 DIAGNOSIS — Y92410 Unspecified street and highway as the place of occurrence of the external cause: Secondary | ICD-10-CM | POA: Diagnosis not present

## 2022-08-29 DIAGNOSIS — E876 Hypokalemia: Secondary | ICD-10-CM | POA: Diagnosis not present

## 2022-08-29 DIAGNOSIS — R531 Weakness: Secondary | ICD-10-CM | POA: Insufficient documentation

## 2022-08-29 DIAGNOSIS — M1712 Unilateral primary osteoarthritis, left knee: Secondary | ICD-10-CM

## 2022-08-29 DIAGNOSIS — I10 Essential (primary) hypertension: Secondary | ICD-10-CM | POA: Diagnosis not present

## 2022-08-29 DIAGNOSIS — S4992XA Unspecified injury of left shoulder and upper arm, initial encounter: Secondary | ICD-10-CM | POA: Diagnosis present

## 2022-08-29 DIAGNOSIS — S46912A Strain of unspecified muscle, fascia and tendon at shoulder and upper arm level, left arm, initial encounter: Secondary | ICD-10-CM

## 2022-08-29 HISTORY — DX: Unspecified osteoarthritis, unspecified site: M19.90

## 2022-08-29 HISTORY — DX: Hypoglycemia, unspecified: E16.2

## 2022-08-29 HISTORY — DX: Irritable bowel syndrome, unspecified: K58.9

## 2022-08-29 LAB — BASIC METABOLIC PANEL
Anion gap: 7 (ref 5–15)
BUN: <5 mg/dL — ABNORMAL LOW (ref 8–23)
CO2: 32 mmol/L (ref 22–32)
Calcium: 9.1 mg/dL (ref 8.9–10.3)
Chloride: 101 mmol/L (ref 98–111)
Creatinine, Ser: 0.53 mg/dL (ref 0.44–1.00)
GFR, Estimated: >60 mL/min (ref >60)
Glucose, Bld: 92 mg/dL (ref 70–99)
Potassium: 2.8 mmol/L — ABNORMAL LOW (ref 3.5–5.1)
Sodium: 140 mmol/L (ref 135–145)

## 2022-08-29 LAB — CBC WITH DIFFERENTIAL/PLATELET
Abs Immature Granulocytes: 0.01 10*3/uL (ref 0.00–0.07)
Basophils Absolute: 0 10*3/uL (ref 0.0–0.1)
Basophils Relative: 1 %
Eosinophils Absolute: 0.1 10*3/uL (ref 0.0–0.5)
Eosinophils Relative: 1 %
HCT: 36.6 % (ref 36.0–46.0)
Hemoglobin: 12.4 g/dL (ref 12.0–15.0)
Immature Granulocytes: 0 %
Lymphocytes Relative: 33 %
Lymphs Abs: 1.5 10*3/uL (ref 0.7–4.0)
MCH: 27.6 pg (ref 26.0–34.0)
MCHC: 33.9 g/dL (ref 30.0–36.0)
MCV: 81.3 fL (ref 80.0–100.0)
Monocytes Absolute: 0.3 10*3/uL (ref 0.1–1.0)
Monocytes Relative: 6 %
Neutro Abs: 2.6 10*3/uL (ref 1.7–7.7)
Neutrophils Relative %: 59 %
Platelets: 264 10*3/uL (ref 150–400)
RBC: 4.5 MIL/uL (ref 3.87–5.11)
RDW: 13.8 % (ref 11.5–15.5)
WBC: 4.5 10*3/uL (ref 4.0–10.5)
nRBC: 0 % (ref 0.0–0.2)

## 2022-08-29 LAB — CBG MONITORING, ED: Glucose-Capillary: 91 mg/dL (ref 70–99)

## 2022-08-29 LAB — MAGNESIUM: Magnesium: 1.7 mg/dL (ref 1.7–2.4)

## 2022-08-29 MED ORDER — LACTATED RINGERS IV BOLUS
1000.0000 mL | Freq: Once | INTRAVENOUS | Status: AC
  Administered 2022-08-29: 1000 mL via INTRAVENOUS

## 2022-08-29 MED ORDER — POTASSIUM CHLORIDE 20 MEQ PO PACK
80.0000 meq | PACK | Freq: Once | ORAL | Status: DC
  Filled 2022-08-29: qty 4
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
## 2022-08-29 NOTE — ED Notes (Signed)
Pt has successfully ambulated to the bathroom multiple times.
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
## 2022-12-01 ENCOUNTER — Other Ambulatory Visit: Payer: Self-pay | Admitting: Internal Medicine

## 2022-12-02 LAB — OVA AND PARASITE EXAMINATION

## 2022-12-05 LAB — SALMONELLA/SHIGELLA CULT, CAMPY EIA AND SHIGA TOXIN RFL ECOLI

## 2022-12-05 LAB — TIQ-NTM

## 2022-12-05 LAB — C. DIFFICILE GDH AND TOXIN A/B

## 2022-12-05 LAB — OVA AND PARASITE EXAMINATION

## 2023-01-01 ENCOUNTER — Other Ambulatory Visit: Payer: Self-pay | Admitting: Internal Medicine

## 2023-01-08 ENCOUNTER — Other Ambulatory Visit: Payer: Self-pay | Admitting: Internal Medicine

## 2023-01-10 LAB — SALMONELLA/SHIGELLA CULT, CAMPY EIA AND SHIGA TOXIN RFL ECOLI
MICRO NUMBER: 15349771
MICRO NUMBER:: 15349772
MICRO NUMBER:: 15349774
Result:: NOT DETECTED
SHIGA RESULT:: NOT DETECTED
SPECIMEN QUALITY: ADEQUATE
SPECIMEN QUALITY:: ADEQUATE
SPECIMEN QUALITY:: ADEQUATE

## 2023-01-10 LAB — OVA AND PARASITE EXAMINATION

## 2023-01-12 LAB — CLOSTRIDIUM DIFFICILE CYTOTOXICITY ASSAY

## 2023-06-21 ENCOUNTER — Encounter: Payer: 59 | Admitting: Obstetrics and Gynecology

## 2023-07-09 ENCOUNTER — Encounter: Payer: 59 | Admitting: Obstetrics and Gynecology

## 2024-07-03 ENCOUNTER — Ambulatory Visit: Admitting: Orthopaedic Surgery
